# Patient Record
Sex: Female | Born: 1945 | Race: White | Hispanic: No | Smoking: Never smoker
Health system: Southern US, Community
[De-identification: ages and names within clinical notes are randomized; demographics above are authoritative.]

## PROBLEM LIST (undated history)

## (undated) DIAGNOSIS — K5792 Diverticulitis of intestine, part unspecified, without perforation or abscess without bleeding: Secondary | ICD-10-CM

## (undated) DIAGNOSIS — F32A Depression, unspecified: Secondary | ICD-10-CM

## (undated) DIAGNOSIS — C801 Malignant (primary) neoplasm, unspecified: Secondary | ICD-10-CM

## (undated) DIAGNOSIS — I10 Essential (primary) hypertension: Secondary | ICD-10-CM

## (undated) DIAGNOSIS — M1990 Unspecified osteoarthritis, unspecified site: Secondary | ICD-10-CM

## (undated) DIAGNOSIS — E785 Hyperlipidemia, unspecified: Secondary | ICD-10-CM

---

## 1999-04-07 HISTORY — PX: VARICOSE VEIN SURGERY: SHX832

## 1999-12-13 ENCOUNTER — Ambulatory Visit (HOSPITAL_COMMUNITY): Admission: RE | Admit: 1999-12-13 | Discharge: 1999-12-13 | Payer: Self-pay | Admitting: Plastic Surgery

## 2002-10-24 ENCOUNTER — Other Ambulatory Visit: Admission: RE | Admit: 2002-10-24 | Discharge: 2002-10-24 | Payer: Self-pay | Admitting: Obstetrics and Gynecology

## 2003-04-07 HISTORY — PX: FACIAL COSMETIC SURGERY: SHX629

## 2003-11-27 ENCOUNTER — Other Ambulatory Visit: Admission: RE | Admit: 2003-11-27 | Discharge: 2003-11-27 | Payer: Self-pay | Admitting: Obstetrics and Gynecology

## 2004-11-27 ENCOUNTER — Other Ambulatory Visit: Admission: RE | Admit: 2004-11-27 | Discharge: 2004-11-27 | Payer: Self-pay | Admitting: Obstetrics and Gynecology

## 2006-01-13 ENCOUNTER — Other Ambulatory Visit: Admission: RE | Admit: 2006-01-13 | Discharge: 2006-01-13 | Payer: Self-pay | Admitting: Obstetrics and Gynecology

## 2006-12-08 ENCOUNTER — Ambulatory Visit: Payer: Self-pay | Admitting: Family Medicine

## 2007-01-26 ENCOUNTER — Ambulatory Visit: Payer: Self-pay | Admitting: Gastroenterology

## 2007-02-09 ENCOUNTER — Other Ambulatory Visit: Admission: RE | Admit: 2007-02-09 | Discharge: 2007-02-09 | Payer: Self-pay | Admitting: Obstetrics and Gynecology

## 2008-02-16 ENCOUNTER — Other Ambulatory Visit: Admission: RE | Admit: 2008-02-16 | Discharge: 2008-02-16 | Payer: Self-pay | Admitting: Obstetrics and Gynecology

## 2009-08-06 ENCOUNTER — Ambulatory Visit: Payer: Self-pay | Admitting: Rheumatology

## 2009-09-19 ENCOUNTER — Ambulatory Visit: Payer: Self-pay | Admitting: Rheumatology

## 2013-03-02 ENCOUNTER — Ambulatory Visit: Payer: Self-pay | Admitting: Obstetrics and Gynecology

## 2013-11-24 ENCOUNTER — Emergency Department (HOSPITAL_COMMUNITY)
Admission: EM | Admit: 2013-11-24 | Discharge: 2013-11-24 | Disposition: A | Discharge status: Home / Self Care | Payer: Medicare Other | Attending: Emergency Medicine | Admitting: Emergency Medicine

## 2013-11-24 ENCOUNTER — Encounter (HOSPITAL_COMMUNITY): Payer: Self-pay | Admitting: Emergency Medicine

## 2013-11-24 DIAGNOSIS — R42 Dizziness and giddiness: Secondary | ICD-10-CM | POA: Insufficient documentation

## 2013-11-24 DIAGNOSIS — R197 Diarrhea, unspecified: Secondary | ICD-10-CM

## 2013-11-24 DIAGNOSIS — R110 Nausea: Secondary | ICD-10-CM

## 2013-11-24 DIAGNOSIS — I10 Essential (primary) hypertension: Secondary | ICD-10-CM | POA: Insufficient documentation

## 2013-11-24 DIAGNOSIS — K921 Melena: Secondary | ICD-10-CM | POA: Insufficient documentation

## 2013-11-24 DIAGNOSIS — Z880 Allergy status to penicillin: Secondary | ICD-10-CM | POA: Insufficient documentation

## 2013-11-24 DIAGNOSIS — Z79899 Other long term (current) drug therapy: Secondary | ICD-10-CM | POA: Insufficient documentation

## 2013-11-24 DIAGNOSIS — R112 Nausea with vomiting, unspecified: Secondary | ICD-10-CM | POA: Insufficient documentation

## 2013-11-24 HISTORY — DX: Essential (primary) hypertension: I10

## 2013-11-24 HISTORY — DX: Diverticulitis of intestine, part unspecified, without perforation or abscess without bleeding: K57.92

## 2013-11-24 LAB — URINALYSIS, ROUTINE W REFLEX MICROSCOPIC
Bilirubin Urine: NEGATIVE
Glucose, UA: NEGATIVE mg/dL
HGB URINE DIPSTICK: NEGATIVE
KETONES UR: 15 mg/dL — AB
Nitrite: NEGATIVE
PROTEIN: NEGATIVE mg/dL
Specific Gravity, Urine: 1.009 (ref 1.005–1.030)
UROBILINOGEN UA: 0.2 mg/dL (ref 0.0–1.0)
pH: 6 (ref 5.0–8.0)

## 2013-11-24 LAB — COMPREHENSIVE METABOLIC PANEL
ALK PHOS: 77 U/L (ref 39–117)
ALT: 16 U/L (ref 0–35)
AST: 27 U/L (ref 0–37)
Albumin: 4.7 g/dL (ref 3.5–5.2)
BILIRUBIN TOTAL: 0.7 mg/dL (ref 0.3–1.2)
BUN: 5 mg/dL — AB (ref 6–23)
CHLORIDE: 94 meq/L — AB (ref 96–112)
CO2: 26 meq/L (ref 19–32)
CREATININE: 0.7 mg/dL (ref 0.50–1.10)
Calcium: 9.8 mg/dL (ref 8.4–10.5)
GFR calc Af Amer: >90 mL/min (ref >90)
GFR, EST NON AFRICAN AMERICAN: 86 mL/min — AB (ref >90)
Glucose, Bld: 108 mg/dL — ABNORMAL HIGH (ref 70–99)
Potassium: 3.4 mEq/L — ABNORMAL LOW (ref 3.7–5.3)
Sodium: 134 mEq/L — ABNORMAL LOW (ref 137–147)
Total Protein: 8 g/dL (ref 6.0–8.3)

## 2013-11-24 LAB — URINE MICROSCOPIC-ADD ON

## 2013-11-24 LAB — CBC WITH DIFFERENTIAL/PLATELET
Basophils Absolute: 0 10*3/uL (ref 0.0–0.1)
Basophils Relative: 0 % (ref 0–1)
Eosinophils Absolute: 0 10*3/uL (ref 0.0–0.7)
Eosinophils Relative: 1 % (ref 0–5)
HEMATOCRIT: 46.3 % — AB (ref 36.0–46.0)
HEMOGLOBIN: 16.3 g/dL — AB (ref 12.0–15.0)
LYMPHS ABS: 1.2 10*3/uL (ref 0.7–4.0)
LYMPHS PCT: 19 % (ref 12–46)
MCH: 31.5 pg (ref 26.0–34.0)
MCHC: 35.2 g/dL (ref 30.0–36.0)
MCV: 89.4 fL (ref 78.0–100.0)
MONO ABS: 0.7 10*3/uL (ref 0.1–1.0)
MONOS PCT: 11 % (ref 3–12)
NEUTROS ABS: 4.5 10*3/uL (ref 1.7–7.7)
Neutrophils Relative %: 69 % (ref 43–77)
Platelets: 211 10*3/uL (ref 150–400)
RBC: 5.18 MIL/uL — AB (ref 3.87–5.11)
RDW: 13 % (ref 11.5–15.5)
WBC: 6.6 10*3/uL (ref 4.0–10.5)

## 2013-11-24 LAB — LIPASE, BLOOD: LIPASE: 32 U/L (ref 11–59)

## 2013-11-24 MED ORDER — PROMETHAZINE HCL 25 MG PO TABS: 25.0000 mg | ORAL_TABLET | Freq: Four times a day (QID) | ORAL | Status: DC | PRN

## 2013-11-24 MED ORDER — SODIUM CHLORIDE 0.9 % IV BOLUS (SEPSIS)
1000.0000 mL | Freq: Once | INTRAVENOUS | Status: AC
  Administered 2013-11-24: 1000 mL via INTRAVENOUS

## 2013-11-24 MED ORDER — ONDANSETRON HCL 4 MG/2ML IJ SOLN
4.0000 mg | Freq: Once | INTRAMUSCULAR | Status: AC
  Administered 2013-11-24: 4 mg via INTRAVENOUS
  Filled 2013-11-24: qty 2

## 2013-11-24 MED ORDER — SODIUM CHLORIDE 0.9 % IV SOLN
INTRAVENOUS | Status: DC
  Administered 2013-11-24: 13:00:00 via INTRAVENOUS

## 2013-11-24 MED ORDER — ONDANSETRON 4 MG PO TBDP: 4.0000 mg | ORAL_TABLET | Freq: Three times a day (TID) | ORAL | Status: DC | PRN

## 2013-11-24 MED ORDER — PROMETHAZINE HCL 25 MG/ML IJ SOLN
12.5000 mg | Freq: Once | INTRAMUSCULAR | Status: AC
  Administered 2013-11-24: 12.5 mg via INTRAVENOUS
  Filled 2013-11-24: qty 1

## 2013-11-24 NOTE — Discharge Instructions (Signed)
As we discussed continue current medications and antibiotics. Followup with your Dr. sometime in the next few days. Your urine today was suggestive of urinary tract infection. Urine culture is pending that'll probably be back over the next couple days. Are not going to start you on any new antibiotics. Start taking the Phenergan every 6 hours as directed. Supplement that with the Zofran sol tablet every 8 hours. Take this around the clock. Return for any newer worse symptoms. As we discussed probably need to be scheduled for a repeat colonoscopy.

## 2013-11-24 NOTE — ED Notes (Signed)
Pt reports diarrhea [place] ago [place], was given antibiotic. Reports nausea, hasn't been able to eat. Thinks she is dehydrated and feels very nauseated. No vomiting.

## 2013-11-24 NOTE — ED Notes (Signed)
Phlebotomy at bedside.

## 2013-11-24 NOTE — ED Notes (Signed)
Dr. Zackowski at bedside  

## 2013-11-24 NOTE — ED Notes (Signed)
Lab called to add on blood work 

## 2013-11-24 NOTE — ED Provider Notes (Signed)
CSN: [id number]     Arrival date & time 11/24/13  1050 History   First MD Initiated Contact with Patient 11/24/13 1116     Chief Complaint  Patient presents with  . Dehydration     (Consider location/radiation/quality/duration/timing/severity/associated sxs/prior Treatment) The history is provided by the patient and the spouse.   68 year old female referred in by her primary care doctor from [place]. Patient's had a persistent diarrheal illness. It started 2 weeks ago however no diarrhea for the past few days. Still with nausea and dizziness. Primary care doctor did extensive lab workup on Tuesday without any significant results. Stool cultures are pending. Patient also had CT scan without contrast done yesterday which was negative. Have patient referred in today for concerns for dehydration. Sounds like on Tuesday they wanted to admit her for hydration and patient refused. Patient's been taking Zofran for the nausea. Was not taking it consistently however though. Patient has nausea and dizziness now. The nausea is intermittent but is really taking her appetite away. Patient has been drinking Gatorade. Patient's been having some problems over the last 2 years. Did not have a regular doctor last colonoscopy was 5 years ago. When patient was down [place] when these symptoms started and was started on antibiotics down there. Did have some bloody diarrhea at that time. Patient now with no further diarrhea 2 days ago still having crampy abdominal pain. That has also resolved.  Past Medical History  Diagnosis Date  . Hypertension   . Diverticulitis    History reviewed. No pertinent past surgical history. No family history on file. History  Substance Use Topics  . Smoking status: Never Smoker   . Smokeless tobacco: Not on file  . Alcohol Use: Yes   OB History   Grav Para Term Preterm Abortions TAB SAB Ect Mult Living                 Review of Systems  Constitutional: Positive for  fatigue. Negative for fever.  HENT: Negative for congestion.   Eyes: Negative for visual disturbance.  Respiratory: Negative for shortness of breath.   Cardiovascular: Negative for chest pain.  Gastrointestinal: Positive for nausea, vomiting, abdominal pain and blood in stool.  Genitourinary: Negative for dysuria.  Musculoskeletal: Negative for myalgias.  Skin: Negative for rash.  Neurological: Positive for dizziness and light-headedness. Negative for headaches.  Hematological: Does not bruise/bleed easily.  Psychiatric/Behavioral: Negative for confusion.      Allergies  Cephalexin; Augmentin; Darvon; Epinephrine hcl; Erythromycin; and Parafon forte dsc  Home Medications   Prior to Admission medications   Medication Sig Start Date End Date Taking? Authorizing Provider  ciprofloxacin (CIPRO) 250 MG tablet Take 250 mg by mouth 2 (two) times daily. Starting 11/22/13 for 10 days 11/22/13  Yes Historical Provider, MD  latanoprost (XALATAN) 0.005 % ophthalmic solution Place 1 drop into both eyes at bedtime. 11/07/13  Yes Historical Provider, MD  losartan-hydrochlorothiazide (HYZAAR) 100-12.5 MG per tablet Take 1 tablet by mouth daily. 09/26/13  Yes Historical Provider, MD  metroNIDAZOLE (FLAGYL) 500 MG tablet Take 1 tablet by mouth 2 (two) times daily. For 10 days starting 11/22/13 11/22/13  Yes Historical Provider, MD  pantoprazole (PROTONIX) 40 MG tablet Take 40 mg by mouth daily. 11/22/13  Yes Historical Provider, MD  sucralfate (CARAFATE) 1 G tablet Take 1 g by mouth 4 (four) times daily. 11/22/13  Yes Historical Provider, MD  zolpidem (AMBIEN) 10 MG tablet Take 5 mg by mouth at bedtime. 10/20/13  Yes Historical Provider, MD  ondansetron (ZOFRAN ODT) 4 MG disintegrating tablet Take 1 tablet (4 mg total) by mouth every 8 (eight) hours as needed for nausea or vomiting. 11/24/13   Shelda JakesScott W. Donovin Kraemer, MD  promethazine (PHENERGAN) 25 MG tablet Take 1 tablet (25 mg total) by mouth every 6 (six) hours as  needed for nausea or vomiting. 11/24/13   Shelda JakesScott W. Nethra Mehlberg, MD   BP 135/90  Pulse 67  Temp(Src) 98.1 F (36.7 C) (Oral)  Resp 16  Wt 128 lb 5 oz (58.202 kg)  SpO2 100% Physical Exam  Nursing note and vitals reviewed. Constitutional: She is oriented to person, place, and time. She appears well-developed and well-nourished. No distress.  HENT:  Head: Normocephalic and atraumatic.  Mucous membranes slightly dry.  Eyes: Conjunctivae and EOM are normal. Pupils are equal, round, and reactive to light.  Neck: Normal range of motion.  Cardiovascular: Normal rate, regular rhythm and normal heart sounds.   No murmur heard. Pulmonary/Chest: Effort normal and breath sounds normal. No respiratory distress.  Abdominal: Soft. Bowel sounds are normal. There is no tenderness.  Musculoskeletal: Normal range of motion. She exhibits no tenderness.  Neurological: She is alert and oriented to person, place, and time. No cranial nerve deficit. Coordination normal.  Skin: Skin is warm. No rash noted.    ED Course  Procedures (including critical care time) Labs Review Labs Reviewed  CBC WITH DIFFERENTIAL - Abnormal; Notable for the following:    RBC 5.18 (*)    Hemoglobin 16.3 (*)    HCT 46.3 (*)    All other components within normal limits  COMPREHENSIVE METABOLIC PANEL - Abnormal; Notable for the following:    Sodium 134 (*)    Potassium 3.4 (*)    Chloride 94 (*)    Glucose, Bld 108 (*)    BUN 5 (*)    GFR calc non Af Amer 86 (*)    All other components within normal limits  URINALYSIS, ROUTINE W REFLEX MICROSCOPIC - Abnormal; Notable for the following:    Ketones, ur 15 (*)    Leukocytes, UA MODERATE (*)    All other components within normal limits  URINE MICROSCOPIC-ADD ON - Abnormal; Notable for the following:    Squamous Epithelial / LPF FEW (*)    All other components within normal limits  URINE CULTURE  LIPASE, BLOOD   Results for orders placed during the hospital encounter of  11/24/13  CBC WITH DIFFERENTIAL      Result Value Ref Range   WBC 6.6  4.0 - 10.5 K/uL   RBC 5.18 (*) 3.87 - 5.11 MIL/uL   Hemoglobin 16.3 (*) 12.0 - 15.0 g/dL   HCT 16.146.3 (*) 09.636.0 - 04.546.0 %   MCV 89.4  78.0 - 100.0 fL   MCH 31.5  26.0 - 34.0 pg   MCHC 35.2  30.0 - 36.0 g/dL   RDW 40.913.0  81.111.5 - 91.415.5 %   Platelets 211  150 - 400 K/uL   Neutrophils Relative % 69  43 - 77 %   Neutro Abs 4.5  1.7 - 7.7 K/uL   Lymphocytes Relative 19  12 - 46 %   Lymphs Abs 1.2  0.7 - 4.0 K/uL   Monocytes Relative 11  3 - 12 %   Monocytes Absolute 0.7  0.1 - 1.0 K/uL   Eosinophils Relative 1  0 - 5 %   Eosinophils Absolute 0.0  0.0 - 0.7 K/uL   Basophils Relative 0  0 - 1 %   Basophils Absolute 0.0  0.0 - 0.1 K/uL  COMPREHENSIVE METABOLIC PANEL      Result Value Ref Range   Sodium 134 (*) 137 - 147 mEq/L   Potassium 3.4 (*) 3.7 - 5.3 mEq/L   Chloride 94 (*) 96 - 112 mEq/L   CO2 26  19 - 32 mEq/L   Glucose, Bld 108 (*) 70 - 99 mg/dL   BUN 5 (*) 6 - 23 mg/dL   Creatinine, Ser 1.610.70  0.50 - 1.10 mg/dL   Calcium 9.8  8.4 - 09.610.5 mg/dL   Total Protein 8.0  6.0 - 8.3 g/dL   Albumin 4.7  3.5 - 5.2 g/dL   AST 27  0 - 37 U/L   ALT 16  0 - 35 U/L   Alkaline Phosphatase 77  39 - 117 U/L   Total Bilirubin 0.7  0.3 - 1.2 mg/dL   GFR calc non Af Amer 86 (*) >90 mL/min   GFR calc Af Amer >90  >90 mL/min  URINALYSIS, ROUTINE W REFLEX MICROSCOPIC      Result Value Ref Range   Color, Urine YELLOW  YELLOW   APPearance CLEAR  CLEAR   Specific Gravity, Urine 1.009  1.005 - 1.030   pH 6.0  5.0 - 8.0   Glucose, UA NEGATIVE  NEGATIVE mg/dL   Hgb urine dipstick NEGATIVE  NEGATIVE   Bilirubin Urine NEGATIVE  NEGATIVE   Ketones, ur 15 (*) NEGATIVE mg/dL   Protein, ur NEGATIVE  NEGATIVE mg/dL   Urobilinogen, UA 0.2  0.0 - 1.0 mg/dL   Nitrite NEGATIVE  NEGATIVE   Leukocytes, UA MODERATE (*) NEGATIVE  LIPASE, BLOOD      Result Value Ref Range   Lipase 32  11 - 59 U/L  URINE MICROSCOPIC-ADD ON      Result Value Ref  Range   Squamous Epithelial / LPF FEW (*) RARE   WBC, UA 7-10  <3 WBC/hpf   Bacteria, UA RARE  RARE    Imaging Review No results found.   EKG Interpretation None      MDM   Final diagnoses:  Nausea  Diarrhea     Patient with about a two-year history of some abdominal diarrhea problems. Recently had to restart it down with [place] primary care doctor in [place] area. Patient [place] started with diarrhea prior to the trip continued while there was seen by a physician and started on antibiotic. Patient did have bloody diarrhea at that time. No diarrhea for the past few days. Patient seen by primary care Dr. Randell [place] on Tuesday started on Cipro and Flagyl head CT scan done yesterday without contrast that was negative. Patient turned in stool cultures yesterday the results are pending. Referred here for concerns of being dehydrated. Patient now does with persistent nausea and dizziness. Patient had abdominal cramping still ongoing on Monday and Tuesday but that has resolved.  Today's workup without significant findings of dehydration. Patient given 1 L of normal saline seems to feel better. Patient also treated with Phenergan which seemed to have helped the nausea significantly. Will continue Zofran and Phenergan orally. Patient's urinalysis is suggestive of perhaps a urinary tract infection.  Urine culture is pending we'll not change antibiotics at this time. Since patient has been on several antibiotics and does have a lot of allergies to antibiotics. Patient's GI problems now currently could be due to a floral change in the gut and perhaps  maybe even would be wise to stop antibiotics and maybe start probe biotics. However urine culture will direct the need for treatment of the ureter there tract infection if present. Patient will have followup with her record Dr. in the next few days.    Shelda Jakes, MD 11/24/13 [phone]

## 2013-11-25 LAB — URINE CULTURE
COLONY COUNT: NO GROWTH
Culture: NO GROWTH

## 2015-05-09 ENCOUNTER — Other Ambulatory Visit: Payer: Self-pay | Admitting: Physician Assistant

## 2015-05-09 DIAGNOSIS — R109 Unspecified abdominal pain: Secondary | ICD-10-CM

## 2015-05-16 ENCOUNTER — Ambulatory Visit
Admission: RE | Admit: 2015-05-16 | Discharge: 2015-05-16 | Disposition: A | Discharge status: Home / Self Care | Payer: Medicare Other | Source: Ambulatory Visit | Attending: Physician Assistant | Admitting: Physician Assistant

## 2015-05-16 DIAGNOSIS — R109 Unspecified abdominal pain: Secondary | ICD-10-CM

## 2016-10-07 ENCOUNTER — Other Ambulatory Visit: Payer: Self-pay | Admitting: Cardiology

## 2016-10-07 DIAGNOSIS — R519 Headache, unspecified: Secondary | ICD-10-CM

## 2016-10-07 DIAGNOSIS — R51 Headache: Principal | ICD-10-CM

## 2016-10-17 ENCOUNTER — Ambulatory Visit
Admission: RE | Admit: 2016-10-17 | Discharge: 2016-10-17 | Disposition: A | Discharge status: Home / Self Care | Payer: Medicare Other | Source: Ambulatory Visit | Attending: Cardiology | Admitting: Cardiology

## 2016-10-17 DIAGNOSIS — R51 Headache: Secondary | ICD-10-CM | POA: Diagnosis not present

## 2016-10-17 DIAGNOSIS — R519 Headache, unspecified: Secondary | ICD-10-CM

## 2016-10-17 LAB — POCT I-STAT CREATININE: Creatinine, Ser: 0.7 mg/dL (ref 0.44–1.00)

## 2016-10-17 MED ORDER — GADOBENATE DIMEGLUMINE 529 MG/ML IV SOLN
10.0000 mL | Freq: Once | INTRAVENOUS | Status: AC | PRN
  Administered 2016-10-17: 10 mL via INTRAVENOUS

## 2017-04-27 ENCOUNTER — Ambulatory Visit: Payer: Self-pay | Admitting: Urology

## 2017-05-05 ENCOUNTER — Ambulatory Visit: Payer: Self-pay | Admitting: Urology

## 2017-05-12 ENCOUNTER — Encounter: Payer: Self-pay | Admitting: Urology

## 2017-05-12 ENCOUNTER — Ambulatory Visit (INDEPENDENT_AMBULATORY_CARE_PROVIDER_SITE_OTHER): Payer: Medicare Other | Admitting: Urology

## 2017-05-12 VITALS — BP 181/80 | HR 86 | Ht 61.0 in | Wt 130.0 lb

## 2017-05-12 DIAGNOSIS — R3129 Other microscopic hematuria: Secondary | ICD-10-CM

## 2017-05-12 DIAGNOSIS — R8290 Unspecified abnormal findings in urine: Secondary | ICD-10-CM | POA: Diagnosis not present

## 2017-05-12 LAB — URINALYSIS, COMPLETE
BILIRUBIN UA: NEGATIVE
Glucose, UA: NEGATIVE
KETONES UA: NEGATIVE
Leukocytes, UA: NEGATIVE
Nitrite, UA: NEGATIVE
Protein, UA: NEGATIVE
Specific Gravity, UA: 1.01 (ref 1.005–1.030)
UUROB: 0.2 mg/dL (ref 0.2–1.0)
pH, UA: 7 (ref 5.0–7.5)

## 2017-05-12 NOTE — Progress Notes (Signed)
05/12/2017 4:38 PM   ISSIS LINDSETH 1945-09-18 [id number]  Referring provider: Armando Gang, FNP [address] [place], [place] [address]  Chief Complaint  Patient presents with  . Hematuria    New Patient    HPI: 71 year old female referred for further evaluation of possible microscopic hematuria.  She was seen and evaluated by Dr. Dalbert Garnet, GYN for multiple recurrent vaginal infections.  During that time, she had several urinalyses including on 01/2017 as well as 03/2017 which showed trace blood on dip without evidence of microscopic hematuria.  Her UA today also shows the same with persistent trace blood on dip but no evidence of microscopic hematuria.  She does have a history of remote urinary tract infection in 01/2016 at which time she grew E. coli.  She was highly symptomatic this point time with dysuria, urgency and frequency.  She denies any urinary symptoms today.  She does report that she has had foul-smelling urine on several occasions which is quite worrisome to her.  She was concerned that this may be related to infection.  She notices it more when she eats certain foods.  She is a non-smoker.  No history of stones.  No flank pain.    PMH: Past Medical History:  Diagnosis Date  . Diverticulitis   . Hypertension     Surgical History: Past Surgical History:  Procedure Laterality Date  . FACIAL COSMETIC SURGERY  04/2003  . VARICOSE VEIN SURGERY  04/1999    Home Medications:  Allergies as of 05/12/2017      Reactions   Cephalexin Anaphylaxis   Articaine-epinephrine    Other reaction(s): Unknown Other reaction(s): Unknown   Augmentin [amoxicillin-pot Clavulanate]    Anxiety, insomnia, diarrhea    Darvon [propoxyphene] Nausea And Vomiting   Epinephrine Other (See Comments)   Increased blood pressure    Erythromycin Other (See Comments)   Unknown    Parafon Forte Dsc [chlorzoxazone] Nausea And Vomiting      Medication List        Accurate as  of 05/12/17  4:38 PM. Always use your most recent med list.          Calcium Carbonate-Vitamin D 600-400 MG-UNIT tablet Take by mouth.   ciprofloxacin 250 MG tablet Commonly known as:  CIPRO Take 250 mg by mouth 2 (two) times daily. Starting 11/22/13 for 10 days   clindamycin 150 MG capsule Commonly known as:  CLEOCIN   cyclobenzaprine 5 MG tablet Commonly known as:  FLEXERIL cyclobenzaprine 5 mg tablet  Take 1 tablet every day by oral route at bedtime.   diazepam 2 MG tablet Commonly known as:  VALIUM Take 0.5 mg by mouth.   diclofenac sodium 1 % Gel Commonly known as:  VOLTAREN Apply topically.   FISH OIL PO Take by mouth.   latanoprost 0.005 % ophthalmic solution Commonly known as:  XALATAN Place 1 drop into both eyes at bedtime.   losartan-hydrochlorothiazide 100-12.5 MG tablet Commonly known as:  HYZAAR Take 1 tablet by mouth daily.   meloxicam 15 MG tablet Commonly known as:  MOBIC meloxicam 15 mg tablet   metroNIDAZOLE 500 MG tablet Commonly known as:  FLAGYL Take 1 tablet by mouth 2 (two) times daily. For 10 days starting 11/22/13   ondansetron 4 MG disintegrating tablet Commonly known as:  ZOFRAN ODT Take 1 tablet (4 mg total) by mouth every 8 (eight) hours as needed for nausea or vomiting.   pantoprazole 40 MG tablet Commonly known as:  PROTONIX  Take 40 mg by mouth daily.   PLASMA PROTEIN FRACTION IV Take by mouth.   promethazine 25 MG tablet Commonly known as:  PHENERGAN Take 1 tablet (25 mg total) by mouth every 6 (six) hours as needed for nausea or vomiting.   sucralfate 1 g tablet Commonly known as:  CARAFATE Take 1 g by mouth 4 (four) times daily.   valACYclovir 1000 MG tablet Commonly known as:  VALTREX valacyclovir 1 gram tablet   zolpidem 10 MG tablet Commonly known as:  AMBIEN Take 5 mg by mouth at bedtime.       Allergies:  Allergies  Allergen Reactions  . Cephalexin Anaphylaxis  . Articaine-Epinephrine     Other  reaction(s): Unknown Other reaction(s): Unknown   . Augmentin [Amoxicillin-Pot Clavulanate]     Anxiety, insomnia, diarrhea   . Darvon [Propoxyphene] Nausea And Vomiting  . Epinephrine Other (See Comments)    Increased blood pressure   . Erythromycin Other (See Comments)    Unknown   . Parafon Forte Dsc [Chlorzoxazone] Nausea And Vomiting    Family History: Family History  Problem Relation Age of Onset  . Prostate cancer Neg Hx   . Bladder Cancer Neg Hx   . Kidney cancer Neg Hx     Social History:  reports that  has never smoked. she has never used smokeless tobacco. She reports that she drinks alcohol. Her drug history is not on file.  ROS: UROLOGY Frequent Urination?: No Hard to postpone urination?: No Burning/pain with urination?: No Get up at night to urinate?: No Leakage of urine?: No Urine stream starts and stops?: No Trouble starting stream?: No Do you have to strain to urinate?: No Blood in urine?: Yes Urinary tract infection?: No Sexually transmitted disease?: No Injury to kidneys or bladder?: No Painful intercourse?: No Weak stream?: No Currently pregnant?: No Vaginal bleeding?: No Last menstrual period?: n  Gastrointestinal Nausea?: No Vomiting?: No Indigestion/heartburn?: No Diarrhea?: No Constipation?: No  Constitutional Fever: No Night sweats?: No Weight loss?: No Fatigue?: No  Skin Skin rash/lesions?: No Itching?: No  Eyes Blurred vision?: No Double vision?: No  Ears/Nose/Throat Sore throat?: No Sinus problems?: No  Hematologic/Lymphatic Swollen glands?: No Easy bruising?: Yes  Cardiovascular Leg swelling?: No Chest pain?: No  Respiratory Cough?: No Shortness of breath?: No  Endocrine Excessive thirst?: No  Musculoskeletal Back pain?: No Joint pain?: Yes  Neurological Headaches?: No Dizziness?: No  Psychologic Depression?: No Anxiety?: No  Physical Exam: BP (!) 181/80   Pulse 86   Ht 5\' 1"  (1.549 m)   Wt  130 lb (59 kg)   BMI 24.56 kg/m   Constitutional:  Alert and oriented, No acute distress. HEENT: Vincent AT, moist mucus membranes.  Trachea midline, no masses. Cardiovascular: No clubbing, cyanosis, or edema. Respiratory: Normal respiratory effort, no increased work of breathing. GI: Abdomen is soft, nontender, nondistended, no abdominal masses GU: No CVA tenderness.  Skin: No rashes, bruises or suspicious lesions. Neurologic: Grossly intact, no focal deficits, moving all 4 extremities. Psychiatric: Normal mood and affect.  Laboratory Data: Lab Results  Component Value Date   WBC 6.6 11/24/2013   HGB 16.3 (H) 11/24/2013   HCT 46.3 (H) 11/24/2013   MCV 89.4 11/24/2013   PLT 211 11/24/2013    Lab Results  Component Value Date   CREATININE 0.70 10/17/2016    Urinalysis UA reviewed today, see epic.  No evidence of microscopic hematuria.  Pertinent Imaging: NA  Assessment & Plan:    Insert assessment  and plan  1. Microscopic hematuria Lengthy discussion today about the definition of microscopic hematuria Review of multiple urinalysis reveal multiple false positives with trace on dip but no evidence of microscopic blood on microscopic evaluation As such, no indication for Kathlene NovemberMike scopic hematuria workup Patient educated today - Urinalysis, Complete  2. Foul smelling urine Likely related to dietary intake Incidentally, patient does report that she is eating a lot of asparagus recently and this is around the time she started noticing her foul-smelling urine which resolved spontaneously Asymptomatic   Return if symptoms worsen or fail to improve.  Vanna ScotlandAshley Ziaire Bieser, MD  [facility] [address], [address] [place], [place] [address] [phone]

## 2017-08-29 IMAGING — MR MR HEAD WO/W CM
9 of 12 series · 31 of 48 positions shown · IV contrast (10mL MULTIHANCE)
Comparison: 12/08/2006

CLINICAL DATA: Left-sided head pressure which is intermittent and
episodic.

EXAM:
MRI HEAD WITHOUT AND WITH CONTRAST
TECHNIQUE: Multiplanar, multiecho pulse sequences of the brain and surrounding
structures were obtained without and with intravenous contrast.
CONTRAST:  10mL MULTIHANCE GADOBENATE DIMEGLUMINE 529 MG/ML IV SOLN

[Series 2: T1 · sagittal · 5.0mm · 0.47mm/px · 1 of 21 slices shown]
[im 1/21]
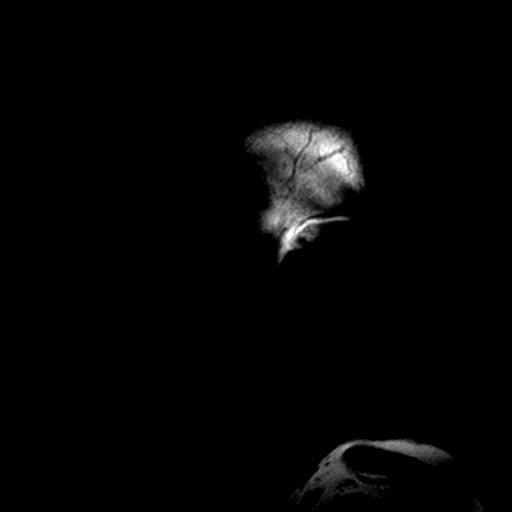

[Series 4: DWI · axial · 3.0mm · 0.94mm/px · z∈[-44,+101]mm · 4 of 50 slices shown (1 of 2)]
[im 1/50]
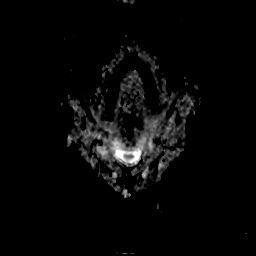
[im 17/50]
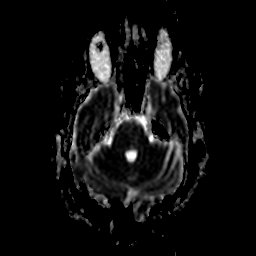
[im 33/50]
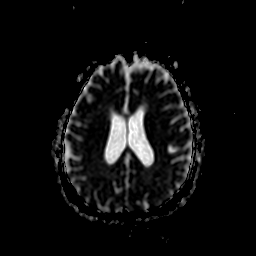
[im 50/50]
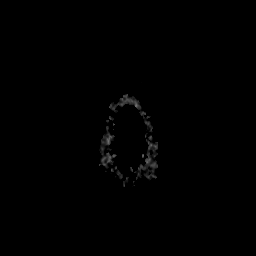

[Series 7: DWI · coronal · 5.0mm · 1.80mm/px · 3 of 39 slices shown (2 of 2)]
[im 1/39]
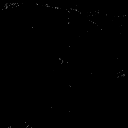
[im 20/39]
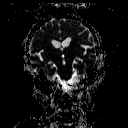
[im 39/39]
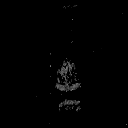

[Series 9: T2 · axial · 5.0mm · 0.45mm/px · z∈[-49,+102]mm · 2 of 23 slices shown (1 of 2)]
[im 1/23]
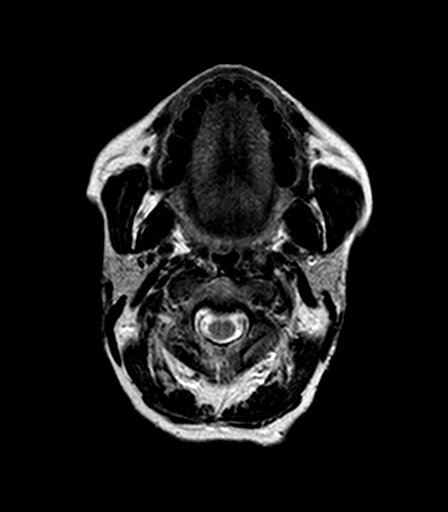
[im 23/23]
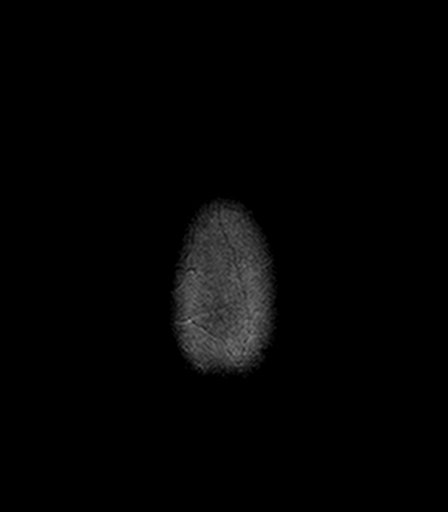

[Series 10: FLAIR · axial · 3.0mm · 0.90mm/px · z∈[-43,+102]mm · 4 of 50 slices shown]
[im 1/50]
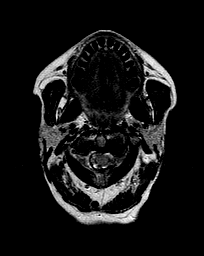
[im 17/50]
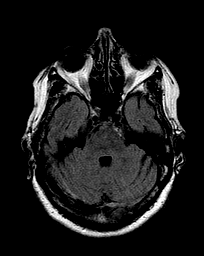
[im 33/50]
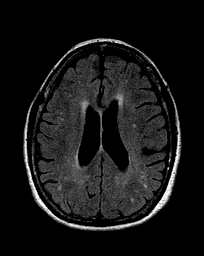
[im 50/50]
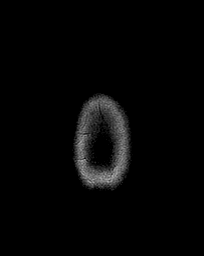

[Series 11: T2 · axial · 5.0mm · 0.45mm/px · z∈[-50,+103]mm · 2 of 27 slices shown (2 of 2)]
[im 1/27]
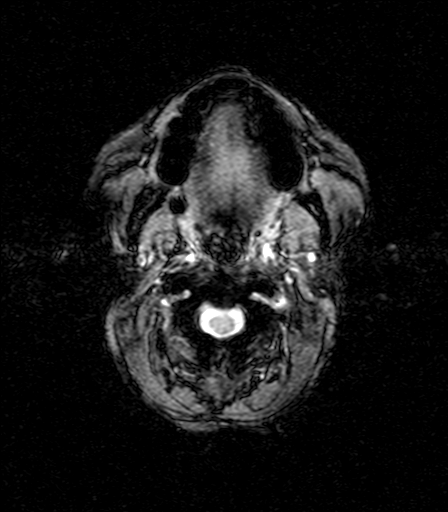
[im 27/27]
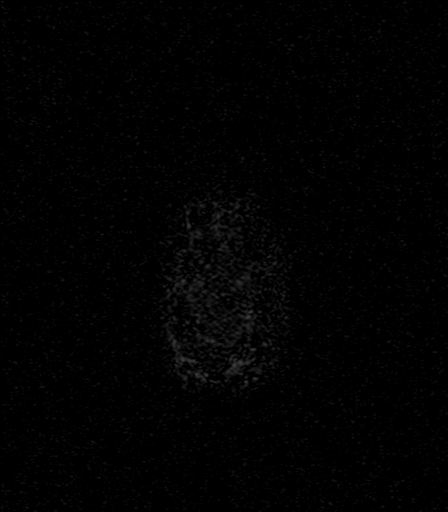

[Series 13: T2 post-contrast · coronal · 5.0mm · 0.45mm/px · 2 of 29 slices shown]
[im 1/29]
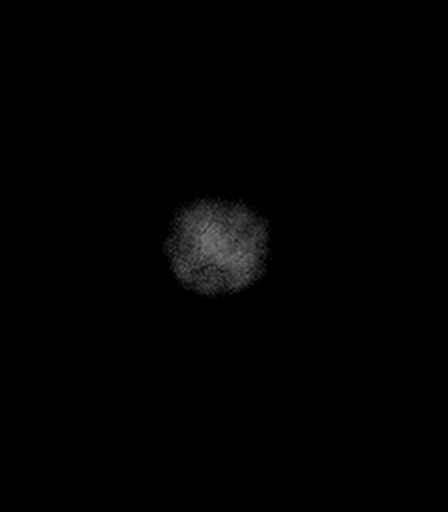
[im 29/29]
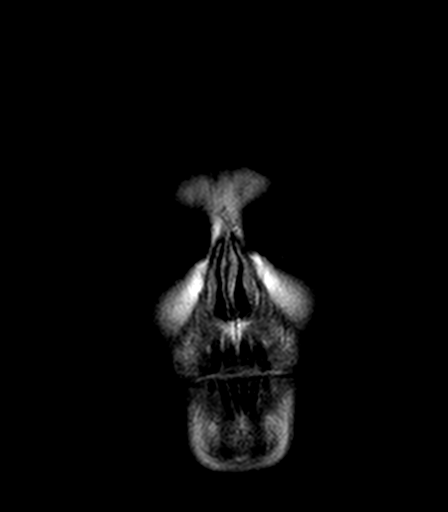

[Series 14: T1 post-contrast · axial · 1.0mm · 0.45mm/px · z∈[-51,+105]mm · 11 of 160 slices shown (1 of 2)]
[im 1/160]
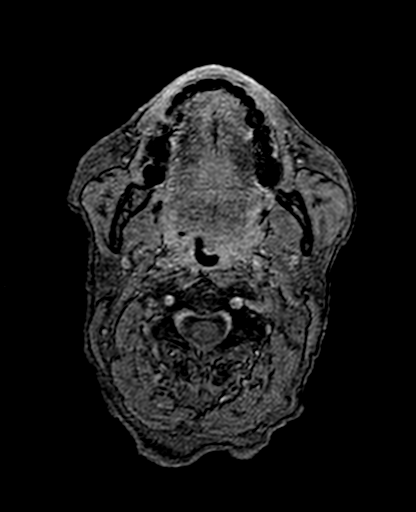
[im 16/160]
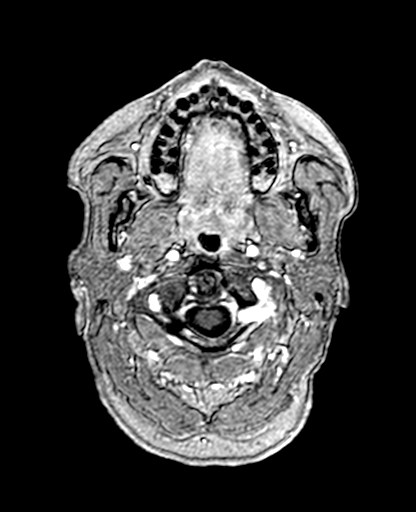
[im 32/160]
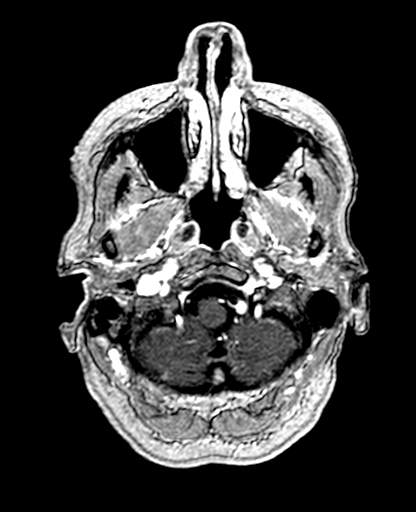
[im 48/160]
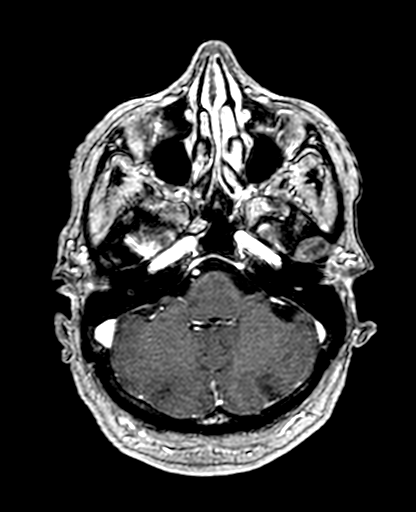
[im 64/160]
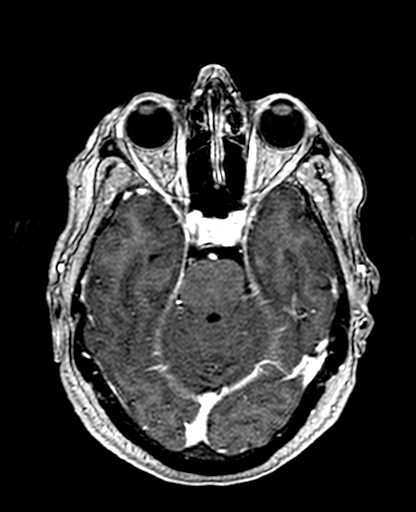
[im 80/160]
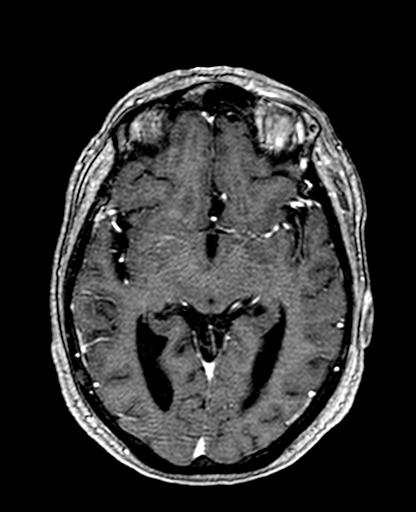
[im 96/160]
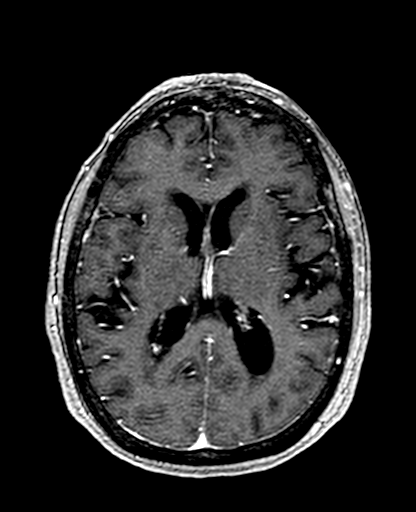
[im 112/160]
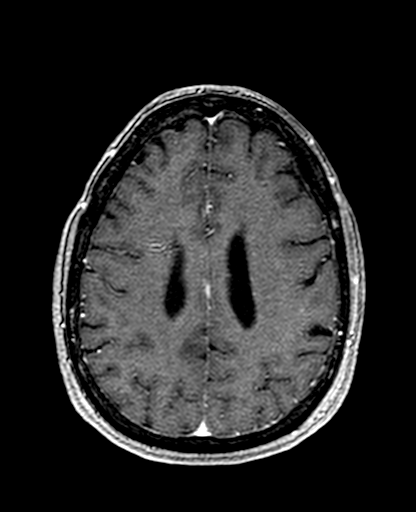
[im 128/160]
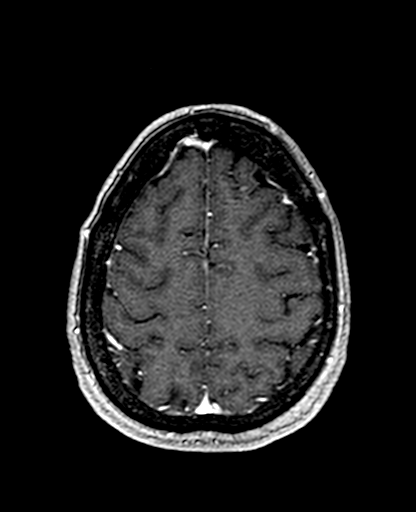
[im 144/160]
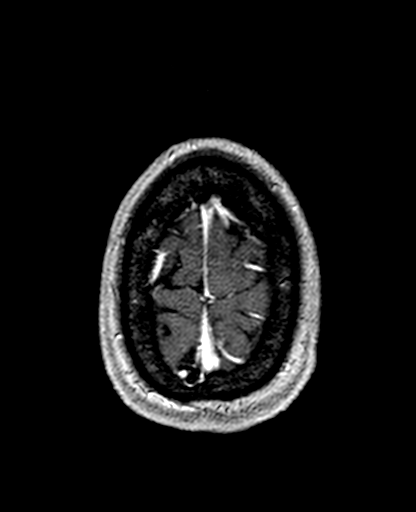
[im 160/160]
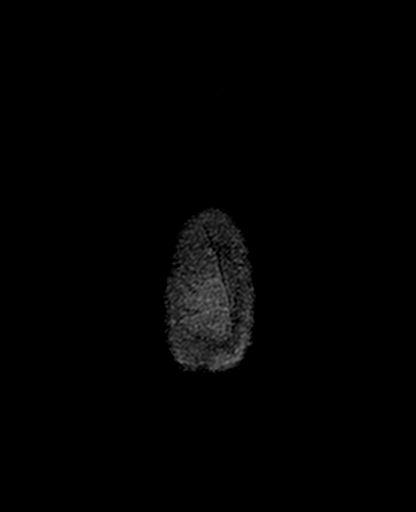

[Series 15: T1 post-contrast · coronal · 5.0mm · 0.45mm/px · 2 of 29 slices shown (2 of 2)]
[im 1/29]
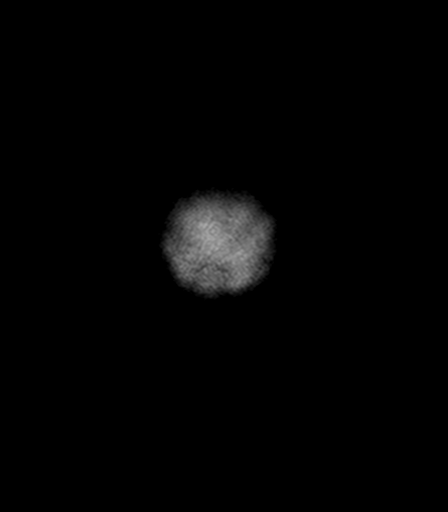
[im 29/29]
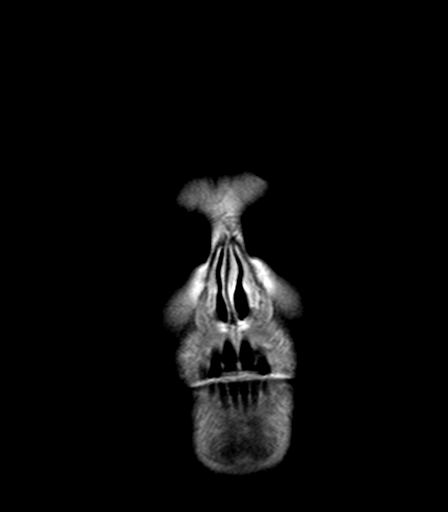

[31 of 48 positions shown; findings below may reference images not displayed]

FINDINGS: Brain: Diffusion imaging does not show any acute or subacute
infarction. The brainstem and cerebellum are normal. Cerebral
hemispheres show scattered foci of T2 and FLAIR signal within the
deep and subcortical white matter consistent with chronic small
vessel change. This is slightly progressive when compared to the
study of 5448. No cortical or large vessel territory infarction. No
mass lesion, hemorrhage, hydrocephalus or extra-axial collection.
After contrast administration, no abnormal enhancement occurs.

Vascular: Major vessels at the base of the brain show flow.

Skull and upper cervical spine: Negative

Sinuses/Orbits: Clear/normal

Other: None significant
IMPRESSION: No cause of left-sided symptoms identified. No acute or reversible
finding. Mild chronic small-vessel change of the cerebral
hemispheric white matter.

## 2019-02-03 ENCOUNTER — Emergency Department
Admission: EM | Admit: 2019-02-03 | Discharge: 2019-02-03 | Disposition: A | Discharge status: Home / Self Care | Payer: Medicare Other | Attending: Emergency Medicine | Admitting: Emergency Medicine

## 2019-02-03 ENCOUNTER — Encounter: Payer: Self-pay | Admitting: Emergency Medicine

## 2019-02-03 DIAGNOSIS — Z79899 Other long term (current) drug therapy: Secondary | ICD-10-CM | POA: Insufficient documentation

## 2019-02-03 DIAGNOSIS — I10 Essential (primary) hypertension: Secondary | ICD-10-CM | POA: Insufficient documentation

## 2019-02-03 DIAGNOSIS — L245 Irritant contact dermatitis due to other chemical products: Secondary | ICD-10-CM | POA: Insufficient documentation

## 2019-02-03 DIAGNOSIS — R531 Weakness: Secondary | ICD-10-CM | POA: Insufficient documentation

## 2019-02-03 DIAGNOSIS — E876 Hypokalemia: Secondary | ICD-10-CM | POA: Diagnosis not present

## 2019-02-03 DIAGNOSIS — R21 Rash and other nonspecific skin eruption: Secondary | ICD-10-CM | POA: Diagnosis present

## 2019-02-03 LAB — HEPATIC FUNCTION PANEL
ALT: 11 U/L (ref 0–44)
AST: 20 U/L (ref 15–41)
Albumin: 4.4 g/dL (ref 3.5–5.0)
Alkaline Phosphatase: 67 U/L (ref 38–126)
Bilirubin, Direct: 0.1 mg/dL (ref 0.0–0.2)
Indirect Bilirubin: 0.8 mg/dL (ref 0.3–0.9)
Total Bilirubin: 0.9 mg/dL (ref 0.3–1.2)
Total Protein: 7.7 g/dL (ref 6.5–8.1)

## 2019-02-03 LAB — BASIC METABOLIC PANEL
Anion gap: 11 (ref 5–15)
BUN: 18 mg/dL (ref 8–23)
CO2: 25 mmol/L (ref 22–32)
Calcium: 9.5 mg/dL (ref 8.9–10.3)
Chloride: 104 mmol/L (ref 98–111)
Creatinine, Ser: 0.7 mg/dL (ref 0.44–1.00)
GFR calc Af Amer: >60 mL/min (ref >60)
GFR calc non Af Amer: >60 mL/min (ref >60)
Glucose, Bld: 112 mg/dL — ABNORMAL HIGH (ref 70–99)
Potassium: 3.2 mmol/L — ABNORMAL LOW (ref 3.5–5.1)
Sodium: 140 mmol/L (ref 135–145)

## 2019-02-03 LAB — TROPONIN I (HIGH SENSITIVITY): Troponin I (High Sensitivity): 4 ng/L (ref <18)

## 2019-02-03 LAB — CBC
HCT: 45.4 % (ref 36.0–46.0)
Hemoglobin: 15.1 g/dL — ABNORMAL HIGH (ref 12.0–15.0)
MCH: 29.3 pg (ref 26.0–34.0)
MCHC: 33.3 g/dL (ref 30.0–36.0)
MCV: 88 fL (ref 80.0–100.0)
Platelets: 206 10*3/uL (ref 150–400)
RBC: 5.16 MIL/uL — ABNORMAL HIGH (ref 3.87–5.11)
RDW: 12.8 % (ref 11.5–15.5)
WBC: 7.5 10*3/uL (ref 4.0–10.5)
nRBC: 0 % (ref 0.0–0.2)

## 2019-02-03 MED ORDER — LOSARTAN POTASSIUM 50 MG PO TABS
100.0000 mg | ORAL_TABLET | Freq: Once | ORAL | Status: AC
  Administered 2019-02-03: 100 mg via ORAL
  Filled 2019-02-03: qty 2

## 2019-02-03 MED ORDER — HYDROCHLOROTHIAZIDE 12.5 MG PO CAPS
12.5000 mg | ORAL_CAPSULE | Freq: Once | ORAL | Status: AC
  Administered 2019-02-03: 12.5 mg via ORAL
  Filled 2019-02-03: qty 1

## 2019-02-03 MED ORDER — DIPHENHYDRAMINE HCL 50 MG/ML IJ SOLN
50.0000 mg | Freq: Once | INTRAMUSCULAR | Status: AC
  Administered 2019-02-03: 50 mg via INTRAVENOUS

## 2019-02-03 MED ORDER — CLOBETASOL PROPIONATE 0.05 % EX OINT: TOPICAL_OINTMENT | Freq: Two times a day (BID) | CUTANEOUS | Status: DC

## 2019-02-03 MED ORDER — DIPHENHYDRAMINE HCL 50 MG PO TABS: 50.0000 mg | ORAL_TABLET | Freq: Every evening | ORAL | 0 refills | Status: DC | PRN

## 2019-02-03 MED ORDER — CLOBETASOL PROPIONATE 0.05 % EX OINT
TOPICAL_OINTMENT | Freq: Once | CUTANEOUS | Status: AC
  Administered 2019-02-03: 07:00:00 via TOPICAL
  Filled 2019-02-03: qty 15

## 2019-02-03 MED ORDER — DIPHENHYDRAMINE HCL 50 MG/ML IJ SOLN
INTRAMUSCULAR | Status: AC
  Filled 2019-02-03: qty 1

## 2019-02-03 MED ORDER — METHYLPREDNISOLONE SODIUM SUCC 125 MG IJ SOLR
INTRAMUSCULAR | Status: AC
  Filled 2019-02-03: qty 2

## 2019-02-03 MED ORDER — POTASSIUM CHLORIDE 20 MEQ PO PACK
40.0000 meq | PACK | Freq: Once | ORAL | Status: AC
  Administered 2019-02-03: 40 meq via ORAL
  Filled 2019-02-03: qty 2

## 2019-02-03 MED ORDER — CLOBETASOL PROPIONATE 0.05 % EX OINT: 1.0000 "application " | TOPICAL_OINTMENT | Freq: Two times a day (BID) | CUTANEOUS | 0 refills | Status: DC
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
## 2019-02-03 NOTE — ED Provider Notes (Signed)
[facility] Emergency Department Provider Note  ____________________________________________  Time seen: Approximately 5:38 AM  I have reviewed the triage vital signs and the nursing notes.   HISTORY  Chief Complaint Rash and Weakness   HPI Martha Nguyen is a 73 y.o. female history of hypertension who presents for evaluation of a rash and weakness.  Patient reports that she sprayed her house with a pesticide called Precor several weeks ago.  The pesticide contains methoprene.  She used gloves but noted that some of the foam fell onto the gloves.  2 days ago patient use the gloves again to pick up some roses from her garden.  She forgot that there was dry pesticide on the gloves and touched the outside of the gloves with her hands.  She then drove to her daughter's kindergarten graduation.  In the car she started having itching in her eyes, redness around her lips. She kept rubbing her face.  The next day, her eyes were swollen and she treated with ice and cool compresses over her face with resolution of her symptoms yesterday evening.  This morning when she woke up she was covered in a pruritic rash involving her buttocks, the inside of her thighs, bilateral breast.  The rash is pruritic in nature, no pain, no fever.  She also felt very weak this morning.  She was afraid she was having an anaphylactic reaction as she had once in the past from an abx. No nausea, no vomiting, no chest pain, no shortness of breath, no increase in oral secretions, no lacrimation, no diarrhea, no vomiting, no fever. She also noted using a new organic soap bar 2 days ago.   Past Medical History:  Diagnosis Date  . Diverticulitis   . Hypertension     There are no active problems to display for this patient.   Past Surgical History:  Procedure Laterality Date  . FACIAL COSMETIC SURGERY  04/2003  . VARICOSE VEIN SURGERY  04/1999    Prior to Admission medications   Medication Sig  Start Date End Date Taking? Authorizing Provider  Calcium Carbonate-Vitamin D 600-400 MG-UNIT tablet Take by mouth.    [provider]  ciprofloxacin (CIPRO) 250 MG tablet Take 250 mg by mouth 2 (two) times daily. Starting 11/22/13 for 10 days 11/22/13   [provider]  clindamycin (CLEOCIN) 150 MG capsule  02/20/17   [provider]  clobetasol ointment (TEMOVATE) 6.37 % Apply 1 application topically 2 (two) times daily. 02/03/19   Rudene Re, MD  cyclobenzaprine (FLEXERIL) 5 MG tablet cyclobenzaprine 5 mg tablet  Take 1 tablet every day by oral route at bedtime.    [provider]  diazepam (VALIUM) 2 MG tablet Take 0.5 mg by mouth.    [provider]  diclofenac sodium (VOLTAREN) 1 % GEL Apply topically. 11/23/15   [provider]  diphenhydrAMINE (BENADRYL) 50 MG tablet Take 1 tablet (50 mg total) by mouth at bedtime as needed for itching. 02/03/19   Rudene Re, MD  latanoprost (XALATAN) 0.005 % ophthalmic solution Place 1 drop into both eyes at bedtime. 11/07/13   [provider]  losartan-hydrochlorothiazide (HYZAAR) 100-12.5 MG per tablet Take 1 tablet by mouth daily. 09/26/13   [provider]  meloxicam (MOBIC) 15 MG tablet meloxicam 15 mg tablet    [provider]  metroNIDAZOLE (FLAGYL) 500 MG tablet Take 1 tablet by mouth 2 (two) times daily. For 10 days starting 11/22/13 11/22/13   [provider]  Omega-3 Fatty Acids (FISH OIL PO) Take by mouth.    [provider]  ondansetron (ZOFRAN ODT) 4 MG disintegrating tablet Take 1 tablet (4 mg total) by mouth every 8 (eight) hours as needed for nausea or vomiting. 11/24/13   Vanetta MuldersZackowski, Scott, MD  pantoprazole (PROTONIX) 40 MG tablet Take 40 mg by mouth daily. 11/22/13   [provider]  PLASMA PROTEIN FRACTION IV Take by mouth.    [provider]  promethazine (PHENERGAN) 25 MG tablet Take 1 tablet (25 mg total) by mouth  every 6 (six) hours as needed for nausea or vomiting. 11/24/13   Vanetta MuldersZackowski, Scott, MD  sucralfate (CARAFATE) 1 G tablet Take 1 g by mouth 4 (four) times daily. 11/22/13   [provider]  valACYclovir (VALTREX) 1000 MG tablet valacyclovir 1 gram tablet    [provider]  zolpidem (AMBIEN) 10 MG tablet Take 5 mg by mouth at bedtime. 10/20/13   [provider]    Allergies Cephalexin, Articaine-epinephrine, Augmentin [amoxicillin-pot clavulanate], Darvon [propoxyphene], Epinephrine, Erythromycin, and Parafon forte dsc [chlorzoxazone]  Family History  Problem Relation Age of Onset  . Prostate cancer Neg Hx   . Bladder Cancer Neg Hx   . Kidney cancer Neg Hx     Social History Social History   Tobacco Use  . Smoking status: Never Smoker  . Smokeless tobacco: Never Used  Substance Use Topics  . Alcohol use: Yes  . Drug use: Not on file    Review of Systems  Constitutional: Negative for fever. + Generalized weak Eyes: Negative for visual changes. ENT: Negative for sore throat. Neck: No neck pain  Cardiovascular: Negative for chest pain. Respiratory: Negative for shortness of breath. Gastrointestinal: Negative for abdominal pain, vomiting or diarrhea. Genitourinary: Negative for dysuria. Musculoskeletal: Negative for back pain. Skin: + rash. Neurological: Negative for headaches, weakness or numbness. Psych: No SI or HI  ____________________________________________   PHYSICAL EXAM:  VITAL SIGNS: ED Triage Vitals [02/03/19 0514]  Enc Vitals Group     BP (!) 201/100     Pulse Rate 73     Resp 18     Temp 98.8 F (37.1 C)     Temp Source Oral     SpO2 98 %     Weight      Height      Head Circumference      Peak Flow      Pain Score      Pain Loc      Pain Edu?      Excl. in GC?     Constitutional: Alert and oriented. Well appearing and in no apparent distress. HEENT:      Head: Normocephalic and atraumatic.         Eyes: Conjunctivae  are normal. Sclera is non-icteric.       Mouth/Throat: Mucous membranes are moist.       Neck: Supple with no signs of meningismus. Cardiovascular: Regular rate and rhythm. No murmurs, gallops, or rubs. 2+ symmetrical distal pulses are present in all extremities. No JVD. Respiratory: Normal respiratory effort. Lungs are clear to auscultation bilaterally. No wheezes, crackles, or rhonchi.  Gastrointestinal: Soft, non tender, and non distended with positive bowel sounds. No rebound or guarding. Musculoskeletal: Nontender with normal range of motion in all extremities. No edema, cyanosis, or erythema of extremities. Neurologic: Normal speech and language. Face is symmetric. Moving all extremities. No gross focal neurologic deficits are appreciated. Skin: Skin is warm, dry  and intact. Wheals located on the inner thighs, bilateral breasts, and bilateral buttock. No mucosal involvement.  Psychiatric: Mood and affect are normal. Speech and behavior are normal.  ____________________________________________   LABS (all labs ordered are listed, but only abnormal results are displayed)  Labs Reviewed  BASIC METABOLIC PANEL - Abnormal; Notable for the following components:      Result Value   Potassium 3.2 (*)    Glucose, Bld 112 (*)    All other components within normal limits  CBC - Abnormal; Notable for the following components:   RBC 5.16 (*)    Hemoglobin 15.1 (*)    All other components within normal limits  HEPATIC FUNCTION PANEL  URINALYSIS, COMPLETE (UACMP) WITH MICROSCOPIC  CBG MONITORING, ED  TROPONIN I (HIGH SENSITIVITY)   ____________________________________________  EKG  ED ECG REPORT I, Nita Sicklearolina Theresia Pree, the attending physician, personally viewed and interpreted this ECG.  Normal sinus rhythm, rate of 77, normal intervals, normal axis, no ST elevations, minimal ST depressions on inferior leads.  No prior for comparison. ____________________________________________   RADIOLOGY  none  ____________________________________________   PROCEDURES  Procedure(s) performed: None Procedures Critical Care performed:  None ____________________________________________   INITIAL IMPRESSION / ASSESSMENT AND PLAN / ED COURSE  73 y.o. female history of hypertension who presents for evaluation of a rash and weakness.  Rash is consistent with contact dermatitis most likely from exposure to a pesticide a few days ago.  Also with a new soap although that is less likely.  No mucosal involvement, no fever, no systemic signs.  Patient is well-appearing.  She is hypertensive.  Has not taken her antihypertensive meds this morning.  Will provide her with a dose here.  Will start patient on topical clobetasol for contact dermatitis and Benadryl for itching. BP trending down after meds given. Mild hypokalemia, supplemented PO.  Discussed close follow-up with PCP and my standard return precautions.       As part of my medical decision making, I reviewed the following data within the electronic MEDICAL RECORD NUMBER Nursing notes reviewed and incorporated, Labs reviewed , EKG interpreted , Old chart reviewed, Notes from prior ED visits and  Controlled Substance Database   Patient was evaluated in Emergency Department today for the symptoms described in the history of present illness. Patient was evaluated in the context of the global COVID-19 pandemic, which necessitated consideration that the patient might be at risk for infection with the SARS-CoV-2 virus that causes COVID-19. Institutional protocols and algorithms that pertain to the evaluation of patients at risk for COVID-19 are in a state of rapid change based on information released by regulatory bodies including the CDC and federal and state organizations. These policies and algorithms were followed during the patient's care in the ED.   ____________________________________________   FINAL CLINICAL IMPRESSION(S) / ED  DIAGNOSES   Final diagnoses:  Irritant contact dermatitis due to other chemical products  Generalized weakness  Hypokalemia      NEW MEDICATIONS STARTED DURING THIS VISIT:  ED Discharge Orders         Ordered    clobetasol ointment (TEMOVATE) 0.05 %  2 times daily     02/03/19 0641    diphenhydrAMINE (BENADRYL) 50 MG tablet  At bedtime PRN     02/03/19 0641           Note:  This document was prepared using Dragon voice recognition software and may include unintentional dictation errors.    Nita SickleVeronese, [place], MD 02/03/19 [phone]

## 2019-10-06 ENCOUNTER — Other Ambulatory Visit: Payer: Self-pay

## 2019-10-06 ENCOUNTER — Emergency Department
Admission: EM | Admit: 2019-10-06 | Discharge: 2019-10-06 | Disposition: A | Discharge status: Home / Self Care | Payer: Medicare Other | Attending: Emergency Medicine | Admitting: Emergency Medicine

## 2019-10-06 DIAGNOSIS — R519 Headache, unspecified: Secondary | ICD-10-CM | POA: Diagnosis not present

## 2019-10-06 DIAGNOSIS — Z79899 Other long term (current) drug therapy: Secondary | ICD-10-CM | POA: Insufficient documentation

## 2019-10-06 DIAGNOSIS — R202 Paresthesia of skin: Secondary | ICD-10-CM | POA: Diagnosis not present

## 2019-10-06 DIAGNOSIS — I10 Essential (primary) hypertension: Secondary | ICD-10-CM | POA: Diagnosis present

## 2019-10-06 MED ORDER — CLONIDINE HCL 0.1 MG PO TABS: 0.1000 mg | ORAL_TABLET | Freq: Two times a day (BID) | ORAL | 0 refills | Status: DC | PRN

## 2019-10-06 MED ORDER — CLONIDINE HCL 0.1 MG PO TABS
0.1000 mg | ORAL_TABLET | Freq: Once | ORAL | Status: AC
  Administered 2019-10-06: 15:00:00 0.1 mg via ORAL
  Filled 2019-10-06: qty 1

## 2019-10-06 NOTE — Discharge Instructions (Signed)
Purchase a blood pressure cuff when you go to your pharmacy to get your medication.  Record your blood pressures for the next few days.  Follow up with your primary care provider for blood pressure recheck and discuss medication changes.   Return to the ER for symptoms of concern if unable to see primary care.

## 2019-10-06 NOTE — ED Triage Notes (Signed)
Pt reports she received covid vaccine and then became hypertensive - covid vaccine nurse called EMS - pt reports that normally her BP is normal - Pt denies any other symptoms at this time

## 2019-10-06 NOTE — ED Provider Notes (Signed)
[facility] Emergency Department Provider Note ____________________________________________   First MD Initiated Contact with Patient 10/06/19 1408     (approximate)  I have reviewed the triage vital signs and the nursing notes.   HISTORY  Chief Complaint Hypertension  HPI Martha Nguyen is a 74 y.o. female presenting to the emergency department for treatment and evaluation after having a hypertensive episode after her COVID-19 vaccination today.  She does have a history of hypertension.  She denies chest pain, or shortness of breath.  She does state that she has had some tingling in both hands.     Past Medical History:  Diagnosis Date  . Diverticulitis   . Hypertension     There are no problems to display for this patient.   Past Surgical History:  Procedure Laterality Date  . FACIAL COSMETIC SURGERY  04/2003  . VARICOSE VEIN SURGERY  04/1999    Prior to Admission medications   Medication Sig Start Date End Date Taking? Authorizing Provider  Calcium Carbonate-Vitamin D 600-400 MG-UNIT tablet Take by mouth.    [provider]  clobetasol ointment (TEMOVATE) 0.05 % Apply 1 application topically 2 (two) times daily. 02/03/19   Nita Sickle, MD  cloNIDine (CATAPRES) 0.1 MG tablet Take 1 tablet (0.1 mg total) by mouth 2 (two) times daily as needed for up to 7 days. 10/06/19 10/13/19  Caitlyn Buchanan, Kasandra Knudsen, FNP  cyclobenzaprine (FLEXERIL) 5 MG tablet cyclobenzaprine 5 mg tablet  Take 1 tablet every day by oral route at bedtime.    [provider]  diazepam (VALIUM) 2 MG tablet Take 0.5 mg by mouth.    [provider]  diclofenac sodium (VOLTAREN) 1 % GEL Apply topically. 11/23/15   [provider]  diphenhydrAMINE (BENADRYL) 50 MG tablet Take 1 tablet (50 mg total) by mouth at bedtime as needed for itching. 02/03/19   Nita Sickle, MD  latanoprost (XALATAN) 0.005 % ophthalmic solution Place 1 drop into both eyes  at bedtime. 11/07/13   [provider]  losartan-hydrochlorothiazide (HYZAAR) 100-12.5 MG per tablet Take 1 tablet by mouth daily. 09/26/13   [provider]  meloxicam (MOBIC) 15 MG tablet meloxicam 15 mg tablet    [provider]  metroNIDAZOLE (FLAGYL) 500 MG tablet Take 1 tablet by mouth 2 (two) times daily. For 10 days starting 11/22/13 11/22/13   [provider]  Omega-3 Fatty Acids (FISH OIL PO) Take by mouth.    [provider]  ondansetron (ZOFRAN ODT) 4 MG disintegrating tablet Take 1 tablet (4 mg total) by mouth every 8 (eight) hours as needed for nausea or vomiting. 11/24/13   Vanetta Mulders, MD  pantoprazole (PROTONIX) 40 MG tablet Take 40 mg by mouth daily. 11/22/13   [provider]  PLASMA PROTEIN FRACTION IV Take by mouth.    [provider]  promethazine (PHENERGAN) 25 MG tablet Take 1 tablet (25 mg total) by mouth every 6 (six) hours as needed for nausea or vomiting. 11/24/13   Vanetta Mulders, MD  sucralfate (CARAFATE) 1 G tablet Take 1 g by mouth 4 (four) times daily. 11/22/13   [provider]  valACYclovir (VALTREX) 1000 MG tablet valacyclovir 1 gram tablet    [provider]  zolpidem (AMBIEN) 10 MG tablet Take 5 mg by mouth at bedtime. 10/20/13   [provider]    Allergies Cephalexin, Articaine-epinephrine, Augmentin [amoxicillin-pot clavulanate], Darvon [propoxyphene], Epinephrine hcl (nasal), Erythromycin, and Parafon forte dsc [chlorzoxazone]  Family History  Problem Relation Age of Onset  . Prostate cancer Neg Hx   . Bladder Cancer Neg Hx   . Kidney cancer Neg Hx     Social History Social History   Tobacco Use  . Smoking status: Never Smoker  . Smokeless tobacco: Never Used  Substance Use Topics  . Alcohol use: Yes  . Drug use: Never    Review of Systems  Constitutional: No fever/chills Eyes: No visual changes. ENT: No sore throat. Cardiovascular: Denies chest  pain. Respiratory: Denies shortness of breath. Gastrointestinal: No abdominal pain. No nausea, no vomiting.  No diarrhea.  No constipation. Genitourinary: Negative for dysuria. Musculoskeletal: Negative for back pain. Skin: Negative for rash. Neurological: Positive for headaches, focal weakness or numbness. ____________________________________________   PHYSICAL EXAM:  VITAL SIGNS: ED Triage Vitals  Enc Vitals Group     BP 10/06/19 1223 (!) 207/90     Pulse Rate 10/06/19 1223 74     Resp 10/06/19 1223 15     Temp 10/06/19 1223 97.8 F (36.6 C)     Temp Source 10/06/19 1223 Oral     SpO2 10/06/19 1223 100 %     Weight 10/06/19 1224 130 lb (59 kg)     Height 10/06/19 1224 5\' 1"  (1.549 m)     Head Circumference --      Peak Flow --      Pain Score 10/06/19 1224 0     Pain Loc --      Pain Edu? --      Excl. in GC? --     Constitutional: Alert and oriented. Well appearing and in no acute distress. Eyes: Conjunctivae are normal. Head: Atraumatic. Nose: No congestion/rhinnorhea. Mouth/Throat: Mucous membranes are moist. Oropharynx non-erythematous. Neck: No stridor.   Hematological/Lymphatic/Immunilogical: No cervical lymphadenopathy. Cardiovascular: Normal rate, regular rhythm. Grossly normal heart sounds.  Good peripheral circulation. Respiratory: Normal respiratory effort.  No retractions. Lungs CTAB. Gastrointestinal: Soft and nontender. No distention.  Genitourinary:  Musculoskeletal: No lower extremity tenderness nor edema.  No joint effusions. Neurologic:  Normal speech and language. No gross focal neurologic deficits are appreciated. No gait instability. Skin:  Skin is warm, dry and intact. No rash noted. Psychiatric: Mood and affect are normal. Speech and behavior are normal.  ____________________________________________   LABS (all labs ordered are listed, but only abnormal results are displayed)  Labs Reviewed - No data to  display ____________________________________________  EKG  Not indicated. ____________________________________________  RADIOLOGY  Official radiology report(s): No results found.  ____________________________________________   PROCEDURES  Procedure(s) performed (including Critical Care):  Procedures  ____________________________________________   INITIAL IMPRESSION / ASSESSMENT AND PLAN     74 year old female presenting to the emergency department for treatment and evaluation after she became hypertensive after receiving her first Covid vaccination.  See HPI for further details.  At this time, the patient states that she "feels fine" but is concerned about her blood pressure being high.  She is unsure whether her hypertensive episode was directly related to the Covid vaccination.  She states that she has had some headaches over the past couple of months but has contributed to allergies.  She states that she has seasonal allergies every year around this time and typically has a headache and scratchy throat.  She has not checked her blood pressure at home.  She has taken her blood pressure medications as prescribed without missing any doses.  ED COURSE  While here, the patient received 0.1 mg of clonidine with successful reduction of her  blood pressure.  Prior to discharge she was 152/68.  She states that her headache has significantly lessened but she believes that when she gets home to get something to eat it will go away completely.  She will be given a prescription for clonidine to be taken if needed up to 2 times per day.  She plans on purchasing a blood pressure monitor at the pharmacy and will check it off and on throughout the day especially if she has any concerning symptoms.  She also plans to call and schedule follow-up appointment with her primary care provider.  She was advised that she may return to the emergency department at any point if she develops symptoms of  concern. ____________________________________________   FINAL CLINICAL IMPRESSION(S) / ED DIAGNOSES  Final diagnoses:  Hypertension, unspecified type     ED Discharge Orders         Ordered    cloNIDine (CATAPRES) 0.1 MG tablet  2 times daily PRN     10/06/19 1603           KATINA REMICK was evaluated in Emergency Department on 10/06/2019 for the symptoms described in the history of present illness. She was evaluated in the context of the global COVID-19 pandemic, which necessitated consideration that the patient might be at risk for infection with the SARS-CoV-2 virus that causes COVID-19. Institutional protocols and algorithms that pertain to the evaluation of patients at risk for COVID-19 are in a state of rapid change based on information released by regulatory bodies including the CDC and federal and state organizations. These policies and algorithms were followed during the patient's care in the ED.   Note:  This document was prepared using Dragon voice recognition software and may include unintentional dictation errors.   Victorino Dike, FNP 10/06/19 1618    Delman Kitten, MD 10/06/19 2122

## 2019-10-06 NOTE — ED Triage Notes (Signed)
First Nurse Note:  Patient sent from vaccine clinic. Patient with hypertensive episode after vaccination. Complaining of tingling in bilateral hands.

## 2019-10-06 NOTE — ED Notes (Signed)
See triage note  Presents with elevated b/p  States she became dizzy after having the COVID vaccine today  States she feels fine at present but is concerned about her b/p

## 2020-01-17 ENCOUNTER — Encounter: Payer: Self-pay | Admitting: Surgery

## 2020-01-17 ENCOUNTER — Ambulatory Visit (INDEPENDENT_AMBULATORY_CARE_PROVIDER_SITE_OTHER): Payer: Medicare Other | Admitting: Surgery

## 2020-01-17 ENCOUNTER — Other Ambulatory Visit: Payer: Self-pay

## 2020-01-17 VITALS — BP 172/101 | HR 82 | Temp 98.7°F | Ht 61.5 in | Wt 136.6 lb

## 2020-01-17 DIAGNOSIS — K4090 Unilateral inguinal hernia, without obstruction or gangrene, not specified as recurrent: Secondary | ICD-10-CM | POA: Diagnosis not present

## 2020-01-17 NOTE — Progress Notes (Signed)
Patient ID: Martha Nguyen, female   DOB: 1946-05-02, 74 y.o.   MRN: [id number]  Chief Complaint: Incisional hernia  History of Present Illness Martha Nguyen is a 74 y.o. female with a bothersome bulge that has been present for roughly over a year.  It is reportedly worse when standing for prolonged periods.  She notes the bulge when standing, which resolves spontaneously when reclining.  She has had an abdominal plasty, but no prior history of hernia repair or any intra-abdominal procedures.  She denies any associated nausea, vomiting, fevers or chills.  She reports the bulge does not stay present with changes in position.  She has had no changes to her bowel function whatsoever.  Past Medical History Past Medical History:  Diagnosis Date  . Diverticulitis   . Hypertension       Past Surgical History:  Procedure Laterality Date  . FACIAL COSMETIC SURGERY  04/2003  . VARICOSE VEIN SURGERY  04/1999    Allergies  Allergen Reactions  . Cephalexin Anaphylaxis  . Articaine-Epinephrine     Other reaction(s): Unknown Other reaction(s): Unknown   . Augmentin [Amoxicillin-Pot Clavulanate]     Anxiety, insomnia, diarrhea   . Darvon [Propoxyphene] Nausea And Vomiting  . Epinephrine Hcl (Nasal) Other (See Comments)    Increased blood pressure   . Erythromycin Other (See Comments)    Unknown   . Parafon Forte Dsc [Chlorzoxazone] Nausea And Vomiting    Current Outpatient Medications  Medication Sig Dispense Refill  . amLODipine (NORVASC) 10 MG tablet Take 10 mg by mouth daily.    . Calcium Carbonate-Vitamin D 600-400 MG-UNIT tablet Take by mouth.    . diclofenac sodium (VOLTAREN) 1 % GEL Apply topically.    . diphenhydrAMINE (BENADRYL) 50 MG tablet Take 1 tablet (50 mg total) by mouth at bedtime as needed for itching. 30 tablet 0  . escitalopram (LEXAPRO) 5 MG tablet SMARTSIG:1 Tablet(s) By Mouth Every Evening    . latanoprost (XALATAN) 0.005 % ophthalmic solution Place 1 drop  into both eyes at bedtime.    Marland Kitchen losartan-hydrochlorothiazide (HYZAAR) 100-12.5 MG per tablet Take 1 tablet by mouth daily.    . Omega-3 Fatty Acids (FISH OIL PO) Take by mouth.    . SBI/Protein Isolate (ENTERAGAM) 5 g PACK Take by mouth.    . zolpidem (AMBIEN) 10 MG tablet Take 5 mg by mouth at bedtime.    . clobetasol ointment (TEMOVATE) 0.05 % Apply 1 application topically 2 (two) times daily. (Patient not taking: Reported on 01/17/2020) 30 g 0  . cloNIDine (CATAPRES) 0.1 MG tablet Take 1 tablet (0.1 mg total) by mouth 2 (two) times daily as needed for up to 7 days. 14 tablet 0  . cyclobenzaprine (FLEXERIL) 5 MG tablet cyclobenzaprine 5 mg tablet  Take 1 tablet every day by oral route at bedtime. (Patient not taking: Reported on 01/17/2020)    . diazepam (VALIUM) 2 MG tablet Take 0.5 mg by mouth. (Patient not taking: Reported on 01/17/2020)    . metroNIDAZOLE (FLAGYL) 500 MG tablet Take 1 tablet by mouth 2 (two) times daily. For 10 days starting 11/22/13 (Patient not taking: Reported on 01/17/2020)    . ondansetron (ZOFRAN ODT) 4 MG disintegrating tablet Take 1 tablet (4 mg total) by mouth every 8 (eight) hours as needed for nausea or vomiting. (Patient not taking: Reported on 01/17/2020) 12 tablet 1  . pantoprazole (PROTONIX) 40 MG tablet Take 40 mg by mouth daily. (Patient not taking: Reported on 01/17/2020)    .  PLASMA PROTEIN FRACTION IV Take by mouth. (Patient not taking: Reported on 01/17/2020)    . promethazine (PHENERGAN) 25 MG tablet Take 1 tablet (25 mg total) by mouth every 6 (six) hours as needed for nausea or vomiting. (Patient not taking: Reported on 01/17/2020) 20 tablet 1  . sucralfate (CARAFATE) 1 G tablet Take 1 g by mouth 4 (four) times daily. (Patient not taking: Reported on 01/17/2020)    . valACYclovir (VALTREX) 1000 MG tablet valacyclovir 1 gram tablet (Patient not taking: Reported on 01/17/2020)     No current facility-administered medications for this visit.    Family  History Family History  Problem Relation Age of Onset  . Prostate cancer Neg Hx   . Bladder Cancer Neg Hx   . Kidney cancer Neg Hx       Social History Social History   Tobacco Use  . Smoking status: Never Smoker  . Smokeless tobacco: Never Used  Substance Use Topics  . Alcohol use: Yes  . Drug use: Never        Review of Systems  Constitutional: Negative.   HENT: Negative.   Eyes: Negative.   Respiratory: Negative.   Cardiovascular: Negative.   Gastrointestinal: Negative.   Genitourinary: Negative.   Skin: Negative.   Neurological: Negative.   Psychiatric/Behavioral: Negative.       Physical Exam Blood pressure (!) 172/101, pulse 82, temperature 98.7 F (37.1 C), temperature source Oral, height 5' 1.5" (1.562 m), weight 136 lb 9.6 oz (62 kg), SpO2 97 %. Last Weight  Most recent update: 01/17/2020 11:03 AM   Weight  62 kg (136 lb 9.6 oz)            CONSTITUTIONAL: Well developed, and nourished, appropriately responsive and aware without distress.   EYES: Sclera non-icteric.   EARS, NOSE, MOUTH AND THROAT: Mask worn.  Hearing is intact to voice.  NECK: Trachea is midline, and there is no jugular venous distension.  LYMPH NODES:  Lymph nodes in the neck are not enlarged. RESPIRATORY:  Lungs are clear, and breath sounds are equal bilaterally. Normal respiratory effort without pathologic use of accessory muscles. CARDIOVASCULAR: Heart is regular in rate and rhythm. GI: The abdomen is soft, nontender, and nondistended. There were no palpable masses. I did not appreciate hepatosplenomegaly. There were normal bowel sounds. GU: She has the transverse lower abdominal scar of an abdominal plasty/tummy tuck.  Within the scar on her left side there is a bulge that comes and easily goes with Valsalva.  It is consistent with a high early small inguinal hernia.  There is no appreciable defect or similar process on the right side. MUSCULOSKELETAL:  Symmetrical muscle tone  appreciated in all four extremities.    SKIN: Skin turgor is normal. No pathologic skin lesions appreciated.  NEUROLOGIC:  Motor and sensation appear grossly normal.  Cranial nerves are grossly without defect. PSYCH:  Alert and oriented to person, place and time. Affect is appropriate for situation.  Data Reviewed I have personally reviewed what is currently available of the patient's imaging, recent labs and medical records.   Labs:  CBC Latest Ref Rng & Units 02/03/2019 11/24/2013  WBC 4.0 - 10.5 K/uL 7.5 6.6  Hemoglobin 12.0 - 15.0 g/dL 15.1(H) 16.3(H)  Hematocrit 36 - 46 % 45.4 46.3(H)  Platelets 150 - 400 K/uL 206 211   CMP Latest Ref Rng & Units 02/03/2019 10/17/2016 11/24/2013  Glucose 70 - 99 mg/dL 825(K) - 539(J)  BUN 8 - 23 mg/dL 18 -  5(L)  Creatinine 0.44 - 1.00 mg/dL 3.29 5.18 8.41  Sodium 135 - 145 mmol/L 140 - 134(L)  Potassium 3.5 - 5.1 mmol/L 3.2(L) - 3.4(L)  Chloride 98 - 111 mmol/L 104 - 94(L)  CO2 22 - 32 mmol/L 25 - 26  Calcium 8.9 - 10.3 mg/dL 9.5 - 9.8  Total Protein 6.5 - 8.1 g/dL 7.7 - 8.0  Total Bilirubin 0.3 - 1.2 mg/dL 0.9 - 0.7  Alkaline Phos 38 - 126 U/L 67 - 77  AST 15 - 41 U/L 20 - 27  ALT 0 - 44 U/L 11 - 16      Imaging:  Within last 24 hrs: No results found.  Assessment    Left inguinal hernia, mildly symptomatic. There are no problems to display for this patient.   Plan    We had a hernia discussion, regarding methods/manner of repair.  What would develop greater concern, with increasing size, pain or tenderness.  Obviously when it affects her activities of daily living or limits her from doing things she enjoys her wants to do.  She is well aware that it always reduces when she lays down, we discussed the need for reduction should it become painful and the bulge be persistent.  I believe she is well aware of all of this and desires to defer elective surgery at this time.  We will gladly see her again should she decide she wishes to pursue  robotic inguinal hernia repair.  Is been a delight to meet this patient and to answer all her questions.  Face-to-face time spent with the patient and accompanying care providers(if present) was 30 minutes, with more than 50% of the time spent counseling, educating, and coordinating care of the patient.      Campbell Lerner M.D., FACS 01/17/2020, 11:49 AM

## 2020-01-17 NOTE — Patient Instructions (Addendum)
Dr.Rodenberg discussed the surgery and risk factors of the surgery with patient at today's visit.  Patient will contact our office if or when she decides she would like to proceed with surgery. Patient was advised she may continue with her daily exercise routines. Inguinal Hernia, Adult An inguinal hernia develops when fat or the intestines push through a weak spot in a muscle where your leg meets your lower abdomen (groin). This creates a bulge. This kind of hernia could also be:  In your scrotum, if you are female.  In folds of skin around your vagina, if you are female. There are three types of inguinal hernias:  Hernias that can be pushed back into the abdomen (are reducible). This type rarely causes pain.  Hernias that are not reducible (are incarcerated).  Hernias that are not reducible and lose their blood supply (are strangulated). This type of hernia requires emergency surgery. What are the causes? This condition is caused by having a weak spot in the muscles or tissues in the groin. This weak spot develops over time. The hernia may poke through the weak spot when you suddenly strain your lower abdominal muscles, such as when you:  Lift a heavy object.  Strain to have a bowel movement. Constipation can lead to straining.  Cough. What increases the risk? This condition is more likely to develop in:  Men.  Pregnant women.  People who: ? Are overweight. ? Work in jobs that require long periods of standing or heavy lifting. ? Have had an inguinal hernia before. ? Smoke or have lung disease. These factors can lead to long-lasting (chronic) coughing. What are the signs or symptoms? Symptoms may depend on the size of the hernia. Often, a small inguinal hernia has no symptoms. Symptoms of a larger hernia may include:  A lump in the groin area. This is easier to see when standing. It might not be visible when lying down.  Pain or burning in the groin. This may get worse when  lifting, straining, or coughing.  A dull ache or a feeling of pressure in the groin.  In men, an unusual lump in the scrotum. Symptoms of a strangulated inguinal hernia may include:  A bulge in your groin that is very painful and tender to the touch.  A bulge that turns red or purple.  Fever, nausea, and vomiting.  Inability to have a bowel movement or to pass gas. How is this diagnosed? This condition is diagnosed based on your symptoms, your medical history, and a physical exam. Your health care provider may feel your groin area and ask you to cough. How is this treated? Treatment depends on the size of your hernia and whether you have symptoms. If you do not have symptoms, your health care provider may have you watch your hernia carefully and have you come in for follow-up visits. If your hernia is large or if you have symptoms, you may need surgery to repair the hernia. Follow these instructions at home: Lifestyle  Avoid lifting heavy objects.  Avoid standing for long periods of time.  Do not use any products that contain nicotine or tobacco, such as cigarettes and e-cigarettes. If you need help quitting, ask your health care provider.  Maintain a healthy weight. Preventing constipation  Take actions to prevent constipation. Constipation leads to straining with bowel movements, which can make a hernia worse or cause a hernia repair to break down. Your health care provider may recommend that you: ? Drink enough fluid to  keep your urine pale yellow. ? Eat foods that are high in fiber, such as fresh fruits and vegetables, whole grains, and beans. ? Limit foods that are high in fat and processed sugars, such as fried or sweet foods. ? Take an over-the-counter or prescription medicine for constipation. General instructions  You may try to push the hernia back in place by very gently pressing on it while lying down. Do not try to force the bulge back in if it will not push in  easily.  Watch your hernia for any changes in shape, size, or color. Get help right away if you notice any changes.  Take over-the-counter and prescription medicines only as told by your health care provider.  Keep all follow-up visits as told by your health care provider. This is important. Contact a health care provider if:  You have a fever.  You develop new symptoms.  Your symptoms get worse. Get help right away if:  You have pain in your groin that suddenly gets worse.  You have a bulge in your groin that: ? Suddenly gets bigger and does not get smaller. ? Becomes red or purple or painful to the touch.  You are a man and you have a sudden pain in your scrotum, or the size of your scrotum suddenly changes.  You cannot push the hernia back in place by very gently pressing on it when you are lying down. Do not try to force the bulge back in if it will not push in easily.  You have nausea or vomiting that does not go away.  You have a fast heartbeat.  You cannot have a bowel movement or pass gas. These symptoms may represent a serious problem that is an emergency. Do not wait to see if the symptoms will go away. Get medical help right away. Call your local emergency services (911 in the [place] Summary  An inguinal hernia develops when fat or the intestines push through a weak spot in a muscle where your leg meets your lower abdomen (groin).  This condition is caused by having a weak spot in muscles or tissue in your groin.  Symptoms may depend on the size of the hernia, and they may include pain or swelling in your groin. A small inguinal hernia often has no symptoms.  Treatment may not be needed if you do not have symptoms. If you have symptoms or a large hernia, you may need surgery to repair the hernia.  Avoid lifting heavy objects. Also avoid standing for long amounts of time. This information is not intended to replace advice given to you by your health care provider.  Make sure you discuss any questions you have with your health care provider. Document Revised: 07/25/2017 Document Reviewed: 03/25/2017 Elsevier Patient Education  2020 [employer].

## 2022-04-08 ENCOUNTER — Other Ambulatory Visit: Payer: Self-pay | Admitting: Unknown Physician Specialty

## 2022-04-08 DIAGNOSIS — E041 Nontoxic single thyroid nodule: Secondary | ICD-10-CM

## 2022-04-10 ENCOUNTER — Ambulatory Visit
Admission: RE | Admit: 2022-04-10 | Discharge: 2022-04-10 | Disposition: A | Discharge status: Home / Self Care | Payer: Medicare Other | Source: Ambulatory Visit | Attending: Unknown Physician Specialty | Admitting: Unknown Physician Specialty

## 2022-04-10 DIAGNOSIS — E041 Nontoxic single thyroid nodule: Secondary | ICD-10-CM

## 2023-10-29 ENCOUNTER — Other Ambulatory Visit: Payer: Self-pay | Admitting: Obstetrics and Gynecology

## 2023-10-29 DIAGNOSIS — R1900 Intra-abdominal and pelvic swelling, mass and lump, unspecified site: Secondary | ICD-10-CM

## 2023-11-04 ENCOUNTER — Ambulatory Visit
Admission: RE | Admit: 2023-11-04 | Discharge: 2023-11-04 | Disposition: A | Discharge status: Home / Self Care | Payer: No Typology Code available for payment source | Source: Ambulatory Visit | Attending: Obstetrics and Gynecology | Admitting: Obstetrics and Gynecology

## 2023-11-04 DIAGNOSIS — R1900 Intra-abdominal and pelvic swelling, mass and lump, unspecified site: Secondary | ICD-10-CM

## 2023-11-26 ENCOUNTER — Emergency Department

## 2023-11-26 ENCOUNTER — Other Ambulatory Visit: Payer: Self-pay

## 2023-11-26 ENCOUNTER — Emergency Department: Admission: EM | Admit: 2023-11-26 | Discharge: 2023-11-26 | Disposition: A | Discharge status: Home / Self Care

## 2023-11-26 DIAGNOSIS — R202 Paresthesia of skin: Secondary | ICD-10-CM | POA: Diagnosis not present

## 2023-11-26 DIAGNOSIS — I10 Essential (primary) hypertension: Secondary | ICD-10-CM | POA: Insufficient documentation

## 2023-11-26 DIAGNOSIS — I6782 Cerebral ischemia: Secondary | ICD-10-CM | POA: Diagnosis not present

## 2023-11-26 DIAGNOSIS — R200 Anesthesia of skin: Secondary | ICD-10-CM | POA: Diagnosis present

## 2023-11-26 DIAGNOSIS — M4322 Fusion of spine, cervical region: Secondary | ICD-10-CM | POA: Insufficient documentation

## 2023-11-26 DIAGNOSIS — M5412 Radiculopathy, cervical region: Secondary | ICD-10-CM | POA: Diagnosis not present

## 2023-11-26 LAB — DIFFERENTIAL
Abs Immature Granulocytes: 0.01 10*3/uL (ref 0.00–0.07)
Basophils Absolute: 0.1 10*3/uL (ref 0.0–0.1)
Basophils Relative: 1 %
Eosinophils Absolute: 0.2 10*3/uL (ref 0.0–0.5)
Eosinophils Relative: 4 %
Immature Granulocytes: 0 %
Lymphocytes Relative: 25 %
Lymphs Abs: 1.7 10*3/uL (ref 0.7–4.0)
Monocytes Absolute: 0.6 10*3/uL (ref 0.1–1.0)
Monocytes Relative: 10 %
Neutro Abs: 4 10*3/uL (ref 1.7–7.7)
Neutrophils Relative %: 60 %

## 2023-11-26 LAB — COMPREHENSIVE METABOLIC PANEL WITH GFR
ALT: 11 U/L (ref 0–44)
AST: 19 U/L (ref 15–41)
Albumin: 4.8 g/dL (ref 3.5–5.0)
Alkaline Phosphatase: 68 U/L (ref 38–126)
Anion gap: 13 (ref 5–15)
BUN: 13 mg/dL (ref 8–23)
CO2: 27 mmol/L (ref 22–32)
Calcium: 9.9 mg/dL (ref 8.9–10.3)
Chloride: 96 mmol/L — ABNORMAL LOW (ref 98–111)
Creatinine, Ser: 0.64 mg/dL (ref 0.44–1.00)
GFR, Estimated: >60 mL/min (ref >60)
Glucose, Bld: 95 mg/dL (ref 70–99)
Potassium: 3.6 mmol/L (ref 3.5–5.1)
Sodium: 136 mmol/L (ref 135–145)
Total Bilirubin: 1.1 mg/dL (ref 0.0–1.2)
Total Protein: 7.9 g/dL (ref 6.5–8.1)

## 2023-11-26 LAB — CBC
HCT: 45.1 % (ref 36.0–46.0)
Hemoglobin: 15.1 g/dL — ABNORMAL HIGH (ref 12.0–15.0)
MCH: 30.3 pg (ref 26.0–34.0)
MCHC: 33.5 g/dL (ref 30.0–36.0)
MCV: 90.6 fL (ref 80.0–100.0)
Platelets: 221 10*3/uL (ref 150–400)
RBC: 4.98 MIL/uL (ref 3.87–5.11)
RDW: 13 % (ref 11.5–15.5)
WBC: 6.6 10*3/uL (ref 4.0–10.5)
nRBC: 0 % (ref 0.0–0.2)

## 2023-11-26 LAB — TROPONIN I (HIGH SENSITIVITY): Troponin I (High Sensitivity): 3 ng/L (ref <18)

## 2023-11-26 LAB — PROTIME-INR
INR: 1.1 (ref 0.8–1.2)
Prothrombin Time: 14.2 s (ref 11.4–15.2)

## 2023-11-26 LAB — ETHANOL: Alcohol, Ethyl (B): <15 mg/dL (ref <15)

## 2023-11-26 LAB — APTT: aPTT: 28 s (ref 24–36)

## 2023-11-26 LAB — CBG MONITORING, ED: Glucose-Capillary: 88 mg/dL (ref 70–99)

## 2023-11-26 MED ORDER — LIDOCAINE 5 % EX PTCH: 1.0000 | MEDICATED_PATCH | CUTANEOUS | Status: DC

## 2023-11-26 MED ORDER — ACETAMINOPHEN 325 MG PO TABS: 650.0000 mg | ORAL_TABLET | ORAL | 2 refills | Status: DC | PRN

## 2023-11-26 MED ORDER — ACETAMINOPHEN 500 MG PO TABS: 1000.0000 mg | ORAL_TABLET | Freq: Once | ORAL | Status: DC

## 2023-11-26 MED ORDER — LIDOCAINE 5 % EX PTCH: 1.0000 | MEDICATED_PATCH | CUTANEOUS | 0 refills | Status: AC

## 2023-11-26 MED ORDER — GABAPENTIN 100 MG PO CAPS: 100.0000 mg | ORAL_CAPSULE | Freq: Three times a day (TID) | ORAL | 0 refills | Status: DC

## 2023-11-26 NOTE — ED Notes (Signed)
 Pt verbalizes understanding of discharge instructions. Opportunity for questioning and answers were provided. Pt discharged from ED to home by self.

## 2023-11-26 NOTE — ED Provider Notes (Signed)
 [facility] Provider Note    Event Date/Time   First MD Initiated Contact with Patient 11/26/23 1129     (approximate)   History   Numbness  Pt to ED via POV from [facility]. Pt ambulatory to triage. Pt reports right arm numbness that started on 5/16. No injury. No pain or tingling. Denies vision changes, speech changes or HA. Pt reports numbness moving to left arm yesterday. Hx of pinch nerve on left.    HPI Martha Nguyen is a 78 y.o. female PMH hypertension, hyperlipidemia, depression presents from clinic for evaluation of right arm numbness x 6-7 days as well as transient left upper extremity numbness - Right upper extremity numbness through mid upper arm down through palm present since 11/19/2020.  Waxing and waning in severity throughout the day though constant.  Since yesterday has had intermittent sensation of left arm numbness as well.  No neck pain, no worsening of symptoms with movement of neck left or right. - No chest pain or shortness of breath       Physical Exam   Triage Vital Signs: ED Triage Vitals  Encounter Vitals Group     BP 11/26/23 1012 (!) 178/81     Systolic BP Percentile --      Diastolic BP Percentile --      Pulse Rate 11/26/23 1012 78     Resp 11/26/23 1012 20     Temp 11/26/23 1012 98.1 F (36.7 C)     Temp Source 11/26/23 1012 Oral     SpO2 11/26/23 1012 100 %     Weight --      Height --      Head Circumference --      Peak Flow --      Pain Score 11/26/23 1009 0     Pain Loc --      Pain Education --      Exclude from Growth Chart --     Most recent vital signs: Vitals:   11/26/23 1012 11/26/23 1233  BP: (!) 178/81 (!) 166/66  Pulse: 78 67  Resp: 20 18  Temp: 98.1 F (36.7 C)   SpO2: 100% 100%     General: Awake, no distress.  CV:  Good peripheral perfusion. RRR, RP 2+ Resp:  Normal effort. CTAB Abd:  No distention. Nontender to deep palpation throughout BUE:  Full range of motion all joints, no  rashes, normal strength and sensation, RP 2+ Neck:  Full rom, no midline ttp Neuro:  Aox4, CN II-XII intact, FNF wnl, finger taps fast b/l, 5/5 strength in bilateral finger extension/grip, arm flexion/extension, EHL/FHL. BUE AG 10+ sec no drift, BLE AG 5+ sec no drift. Ambulates with steady gait. SILT except for subjectively reduced sensation throughout right upper extremity. Negative Rhomberg.    ED Results / Procedures / Treatments   Labs (all labs ordered are listed, but only abnormal results are displayed) Labs Reviewed  CBC - Abnormal; Notable for the following components:      Result Value   Hemoglobin 15.1 (*)    All other components within normal limits  COMPREHENSIVE METABOLIC PANEL WITH GFR - Abnormal; Notable for the following components:   Chloride 96 (*)    All other components within normal limits  PROTIME-INR  APTT  DIFFERENTIAL  ETHANOL  CBG MONITORING, ED  TROPONIN I (HIGH SENSITIVITY)     EKG  See ED course below.   RADIOLOGY Radiology interpreted by myself and radiology reports reviewed.  MRI report pending.  PROCEDURES:  Critical Care performed: No  Procedures   MEDICATIONS ORDERED IN ED: Medications  acetaminophen (TYLENOL) tablet 1,000 mg (1,000 mg Oral Patient Refused/Not Given 11/26/23 1500)  lidocaine (LIDODERM) 5 % 1 patch (1 patch Transdermal Patient Refused/Not Given 11/26/23 1459)     IMPRESSION / MDM / ASSESSMENT AND PLAN / ED COURSE  I reviewed the triage vital signs and the nursing notes.                              DDX/MDM/AP: Differential diagnosis includes, but is not limited to, CVA versus cervical radiculopathy.  Doubt electrolyte abnormality.  Doubt anginal equivalent.  Plan: - Labs - EKG - CT head   Patient's presentation is most consistent with acute presentation with potential threat to life or bodily function.   ED course below.  Laboratory workup and EKG unremarkable, CT head with no acute pathology.  Patient  with persistent right upper extremity numbness given-escalated to MRI.  If negative, most consistent with a cervical radiculopathy and would recommend Tylenol, Lidoderm, gabapentin with plan for outpatient follow-up.  Signed out to oncoming ED provider pending results of MRI.  Clinical Course as of 11/26/23 1535  Thu Nov 26, 2023  1143 Cbc, bmp  reviewed, unremarkable Glucose wnl [MM]  1237 Delayed entry Initial EKG reading possible STEMI given isolated elevation in V2.  No reciprocal depressions on my interpretation.  Repeat EKG appears very similar but is not read as STEMI by machine.  Patient with no chest pain and symptoms of extremity numbness have been ongoing for about 1 week, will defer STEMI activation at this time and follow troponin which is in process. [MM]  1311 Trop 3, not c/w ACS.  [MM]  1332 CTH: IMPRESSION: No CT evidence of acute intracranial abnormality.  Mild chronic microvascular ischemic changes. Mild parenchymal volume loss.   [MM]    Clinical Course User Index [MM] Collis Deaner, MD     FINAL CLINICAL IMPRESSION(S) / ED DIAGNOSES   Final diagnoses:  Paresthesia  Cervical radiculopathy     Rx / DC Orders   ED Discharge Orders          Ordered    acetaminophen (TYLENOL) 325 MG tablet  Every 4 hours PRN        11/26/23 1532    lidocaine (LIDODERM) 5 %  Every 24 hours        11/26/23 1532    gabapentin (NEURONTIN) 100 MG capsule  3 times daily        11/26/23 1532             Note:  This document was prepared using Dragon voice recognition software and may include unintentional dictation errors.   Collis Deaner, MD 11/26/23 1535

## 2023-11-26 NOTE — ED Triage Notes (Addendum)
 Pt to ED via POV from [facility]. Pt ambulatory to triage. Pt reports right arm numbness that started on 5/16. No injury. No pain or tingling. Denies vision changes, speech changes or HA. Pt reports numbness moving to left arm yesterday. Hx of pinch nerve on left.

## 2023-11-26 NOTE — ED Notes (Signed)
 Called lab to add on trop

## 2023-11-26 NOTE — Discharge Instructions (Signed)
 Your evaluation in the emergency department was reassuring, and we saw no evidence of underlying stroke.  I recommend using Tylenol, Lidoderm patches, and gabapentin as needed for any ongoing discomfort.  Please do follow-up with your primary care provider for reevaluation, and return to the emergency department with any new or worsening symptoms.

## 2024-01-20 ENCOUNTER — Ambulatory Visit: Payer: Self-pay | Admitting: Surgery

## 2024-01-20 NOTE — H&P (View-Only) (Signed)
 Subjective:   CC: Non-recurrent bilateral inguinal hernia without obstruction or gangrene [K40.20]  HPI: referred by Martha Nguyen* for evaluation of above.   History of Present Illness Martha Nguyen is a 78 year old female who presents with bilateral hernias for surgical evaluation.  Three months ago, she noticed a hernia on her left side, which has increased in size. The hernias are not painful and can be reduced manually. She is concerned about the impact on her active lifestyle, which includes lifting and working out at [employer], and is eager for surgery to resume activities without restrictions. Her surgical history includes only varicose vein removal, with no surgeries in the hernia area.   Past Medical History:  has a past medical history of Anxiety, Cat allergies, Depression, Fracture, Head ache, Headache, temporal (10/07/2016), History of cataract, Hyperlipidemia, Hypertension, Inguinal hernia, Insomnia, Ocular hypertension, Osteoarthritis, Pelvic pain in female, and Thickened endometrium.  Past Surgical History:  Past Surgical History:  Procedure Laterality Date   EXCISION FINGER BONE CYST/TUMOR Right 09/27/2008   cancer on nose  07/15/2018   face lift     vein stripping     wisdom teeth      Family History: family history includes Brain hemorrhage in her father; Dementia in her maternal grandmother and mother; Diabetes type II in her brother; Lung cancer in her father; Stroke in her father.  Social History:  reports that she has never smoked. She has never used smokeless tobacco. She reports that she does not currently use alcohol. She reports that she does not use drugs.  Current Medications: has a current medication list which includes the following prescription(s): acetaminophen , amlodipine, ammonium lactate, ascorbic acid (vitamin c), cholecalciferol, clobetasol , escitalopram oxalate, hyaluronate sodium, lactobacillus acidophilus, latanoprost,  losartan -hydrochlorothiazide , omega-3 fatty acids/fish oil, vit c/e/zn/coppr/lutein/zeaxan, zolpidem, and gabapentin .  Allergies:  Allergies as of 01/20/2024 - Reviewed 01/20/2024  Allergen Reaction Noted   Flagyl [metronidazole] Swelling 08/04/2023   Keflex [cephalexin] Shortness Of Breath 11/04/2013   Epinephrine Palpitations 11/24/2013   Septocaine [articaine-epinephrine] Other (See Comments) 11/04/2013   Amoxicillin-pot clavulanate Diarrhea and Anxiety 07/13/2014   Darvocet a500 [propoxyphene n-acetaminophen ] Nausea 11/04/2013   Darvon [propoxyphene] Nausea 11/04/2013   Erythromycin Nausea and Abdominal Pain 07/13/2014   Parafon forte [chlorzoxazone] Vomiting 11/04/2013    ROS:  A 15 point review of systems was performed and pertinent positives and negatives noted in HPI   Objective:     BP (!) 153/75   Pulse 66   Ht 156.2 cm (5' 1.5)   Wt 60.8 kg (134 lb)   BMI 24.91 kg/m   Constitutional :  Alert, cooperative, no distress  Lymphatics/Throat:  Supple, no lymphadenopathy  Respiratory:  clear to auscultation bilaterally  Cardiovascular:  regular rate and rhythm  Gastrointestinal: soft, non-tender; bowel sounds normal; no masses,  no organomegaly. inguinal hernia noted.  small, reducible, no overlying skin changes, and bilateral  Musculoskeletal: Steady gait and movement  Skin: Cool and moist  Psychiatric: Normal affect, non-agitated, not confused       LABS:  N/a   RADS: CLINICAL DATA:  Right groin swelling.   EXAM:  ULTRASOUND OF RIGHT GROIN SOFT TISSUES   TECHNIQUE:  Ultrasound examination of the groin soft tissues was performed in  the area of clinical concern.   COMPARISON:  None Available.   FINDINGS:  Elongated, heterogeneous echogenic lesion extends into the right  inguinal canal, measuring 4.0 x 1.7 x 1.8 cm. This moves with  Valsalva consistent with hernia.  IMPRESSION:  1. Right inguinal hernia. Heterogeneous material protrudes into the   inguinal canal that may reflect peritoneal fat or potentially bowel.  This corresponds to the palpable abnormality.    Electronically Signed    By: Alm Parkins M.D.    On: 11/16/2023 11:44  Assessment:       Non-recurrent bilateral inguinal hernia without obstruction or gangrene [K40.20]  Plan:     1. Non-recurrent bilateral inguinal hernia without obstruction or gangrene [K40.20]   Discussed the risk of surgery including recurrence, which can be up to 50% in the case of incisional or complex hernias, possible use of prosthetic materials (mesh) and the increased risk of mesh infxn if used, bleeding, chronic pain, post-op infxn, post-op SBO or ileus, and possible re-operation to address said risks. The risks of general anesthetic, if used, includes MI, CVA, sudden death or even reaction to anesthetic medications also discussed. Alternatives include continued observation.  Benefits include possible symptom relief, prevention of incarceration, strangulation, enlargement in size over time, and the risk of emergency surgery in the face of strangulation.   Typical post-op recovery time of 3-5 days with 2 weeks of activity restrictions were also discussed.  ED return precautions given for sudden increase in pain, size of hernia with accompanying fever, nausea, and/or vomiting.  The patient verbalized understanding and all questions were answered to the patient's satisfaction.   2. Patient has elected to proceed with surgical treatment. Procedure will be scheduled. bilateral, robotic assisted laparoscopic  labs/images/medications/previous chart entries reviewed personally and relevant changes/updates noted above.

## 2024-01-20 NOTE — H&P (Signed)
 Subjective:   CC: Non-recurrent bilateral inguinal hernia without obstruction or gangrene [K40.20]  HPI: referred by [name]* for evaluation of above.   History of Present Illness Martha Nguyen is a 78 year old female who presents with bilateral hernias for surgical evaluation.  Three months ago, she noticed a hernia on her left side, which has increased in size. The hernias are not painful and can be reduced manually. She is concerned about the impact on her active lifestyle, which includes lifting and working out at [employer], and is eager for surgery to resume activities without restrictions. Her surgical history includes only varicose vein removal, with no surgeries in the hernia area.   Past Medical History:  has a past medical history of Anxiety, Cat allergies, Depression, Fracture, Head ache, Headache, temporal (10/07/2016), History of cataract, Hyperlipidemia, Hypertension, Inguinal hernia, Insomnia, Ocular hypertension, Osteoarthritis, Pelvic pain in female, and Thickened endometrium.  Past Surgical History:  Past Surgical History:  Procedure Laterality Date   EXCISION FINGER BONE CYST/TUMOR Right 09/27/2008   cancer on nose  07/15/2018   face lift     vein stripping     wisdom teeth      Family History: family history includes Brain hemorrhage in her father; Dementia in her maternal grandmother and mother; Diabetes type II in her brother; Lung cancer in her father; Stroke in her father.  Social History:  reports that she has never smoked. She has never used smokeless tobacco. She reports that she does not currently use alcohol. She reports that she does not use drugs.  Current Medications: has a current medication list which includes the following prescription(s): acetaminophen , amlodipine, ammonium lactate, ascorbic acid (vitamin c), cholecalciferol, clobetasol , escitalopram oxalate, hyaluronate sodium, lactobacillus acidophilus, latanoprost,  losartan -hydrochlorothiazide , omega-3 fatty acids/fish oil, vit c/e/zn/coppr/lutein/zeaxan, zolpidem, and gabapentin .  Allergies:  Allergies as of 01/20/2024 - Reviewed 01/20/2024  Allergen Reaction Noted   Flagyl [metronidazole] Swelling 08/04/2023   Keflex [cephalexin] Shortness Of Breath 11/04/2013   Epinephrine Palpitations 11/24/2013   Septocaine [articaine-epinephrine] Other (See Comments) 11/04/2013   Amoxicillin-pot clavulanate Diarrhea and Anxiety 07/13/2014   Darvocet a500 [propoxyphene n-acetaminophen ] Nausea 11/04/2013   Darvon [propoxyphene] Nausea 11/04/2013   Erythromycin Nausea and Abdominal Pain 07/13/2014   Parafon forte [chlorzoxazone] Vomiting 11/04/2013    ROS:  A 15 point review of systems was performed and pertinent positives and negatives noted in HPI   Objective:     BP (!) 153/75   Pulse 66   Ht 156.2 cm (5' 1.5)   Wt 60.8 kg (134 lb)   BMI 24.91 kg/m   Constitutional :  Alert, cooperative, no distress  Lymphatics/Throat:  Supple, no lymphadenopathy  Respiratory:  clear to auscultation bilaterally  Cardiovascular:  regular rate and rhythm  Gastrointestinal: soft, non-tender; bowel sounds normal; no masses,  no organomegaly. inguinal hernia noted.  small, reducible, no overlying skin changes, and bilateral  Musculoskeletal: Steady gait and movement  Skin: Cool and moist  Psychiatric: Normal affect, non-agitated, not confused       LABS:  N/a   RADS: CLINICAL DATA:  Right groin swelling.   EXAM:  ULTRASOUND OF RIGHT GROIN SOFT TISSUES   TECHNIQUE:  Ultrasound examination of the groin soft tissues was performed in  the area of clinical concern.   COMPARISON:  None Available.   FINDINGS:  Elongated, heterogeneous echogenic lesion extends into the right  inguinal canal, measuring 4.0 x 1.7 x 1.8 cm. This moves with  Valsalva consistent with hernia.  IMPRESSION:  1. Right inguinal hernia. Heterogeneous material protrudes into the   inguinal canal that may reflect peritoneal fat or potentially bowel.  This corresponds to the palpable abnormality.    Electronically Signed    By: Alm Parkins M.D.    On: 11/16/2023 11:44  Assessment:       Non-recurrent bilateral inguinal hernia without obstruction or gangrene [K40.20]  Plan:     1. Non-recurrent bilateral inguinal hernia without obstruction or gangrene [K40.20]   Discussed the risk of surgery including recurrence, which can be up to 50% in the case of incisional or complex hernias, possible use of prosthetic materials (mesh) and the increased risk of mesh infxn if used, bleeding, chronic pain, post-op infxn, post-op SBO or ileus, and possible re-operation to address said risks. The risks of general anesthetic, if used, includes MI, CVA, sudden death or even reaction to anesthetic medications also discussed. Alternatives include continued observation.  Benefits include possible symptom relief, prevention of incarceration, strangulation, enlargement in size over time, and the risk of emergency surgery in the face of strangulation.   Typical post-op recovery time of 3-5 days with 2 weeks of activity restrictions were also discussed.  ED return precautions given for sudden increase in pain, size of hernia with accompanying fever, nausea, and/or vomiting.  The patient verbalized understanding and all questions were answered to the patient's satisfaction.   2. Patient has elected to proceed with surgical treatment. Procedure will be scheduled. bilateral, robotic assisted laparoscopic  labs/images/medications/previous chart entries reviewed personally and relevant changes/updates noted above.

## 2024-01-27 ENCOUNTER — Other Ambulatory Visit: Payer: Self-pay

## 2024-01-27 ENCOUNTER — Encounter
Admission: RE | Admit: 2024-01-27 | Discharge: 2024-01-27 | Disposition: A | Discharge status: Home / Self Care | Source: Ambulatory Visit | Attending: Surgery | Admitting: Surgery

## 2024-01-27 HISTORY — DX: Depression, unspecified: F32.A

## 2024-01-27 HISTORY — DX: Malignant (primary) neoplasm, unspecified: C80.1

## 2024-01-27 HISTORY — DX: Hyperlipidemia, unspecified: E78.5

## 2024-01-27 HISTORY — DX: Unspecified osteoarthritis, unspecified site: M19.90

## 2024-01-27 NOTE — Patient Instructions (Addendum)
 Your procedure is scheduled on: 01/29/24 Report to the Registration Desk on the 1st floor of the [facility]. To find out your arrival time, please call [phone] between 1PM - 3PM on: 01/28/24 If your arrival time is 6:00 am, do not arrive before that time as the [facility] entrance doors do not open until 6:00 am.  REMEMBER: Instructions that are not followed completely may result in serious medical risk, up to and including death; or upon the discretion of your surgeon and anesthesiologist your surgery may need to be rescheduled.  Do not eat food after midnight the night before surgery.  No gum chewing or hard candies.  You may however, drink CLEAR liquids up to 2 hours before you are scheduled to arrive for your surgery. Do not drink anything within 2 hours of your scheduled arrival time.  Clear liquids include: - water  - apple juice without pulp - gatorade (not RED colors) - black coffee or tea (Do NOT add milk or creamers to the coffee or tea) Do NOT drink anything that is not on this list.   One week prior to surgery: Stop Anti-inflammatories (NSAIDS) such as Advil, Aleve, Ibuprofen, Motrin, Naproxen, Naprosyn and Aspirin based products such as Excedrin, Goody's Powder, BC Powder. You may continue to take Tylenol  if needed for pain up until the day of surgery.  Stop ANY OVER THE COUNTER supplements until after surgery.   ON THE DAY OF SURGERY ONLY TAKE THESE MEDICATIONS WITH SIPS OF WATER:  amLODipine (NORVASC)  escitalopram (LEXAPRO)    No Alcohol for 24 hours before or after surgery.  No Smoking including e-cigarettes for 24 hours before surgery.  No chewable tobacco products for at least 6 hours before surgery.  No nicotine patches on the day of surgery.  Do not use any recreational drugs for at least a week (preferably 2 weeks) before your surgery.  Please be advised that the combination of cocaine and anesthesia may have negative outcomes, up to and  including death. If you test positive for cocaine, your surgery will be cancelled.  On the morning of surgery brush your teeth with toothpaste and water, you may rinse your mouth with mouthwash if you wish. Do not swallow any toothpaste or mouthwash.  Use CHG Soap or wipes as directed on instruction sheet.  Do not wear jewelry, make-up, hairpins, clips or nail polish.  For welded (permanent) jewelry: bracelets, anklets, waist bands, etc.  Please have this removed prior to surgery.  If it is not removed, there is a chance that hospital personnel will need to cut it off on the day of surgery.  Do not wear lotions, powders, or perfumes.   Do not shave body hair from the neck down 48 hours before surgery.  Contact lenses, hearing aids and dentures may not be worn into surgery.  Do not bring valuables to the hospital. [facility] is not responsible for any missing/lost belongings or valuables.   Notify your doctor if there is any change in your medical condition (cold, fever, infection).  Wear comfortable clothing (specific to your surgery type) to the hospital.  After surgery, you can help prevent lung complications by doing breathing exercises.  Take deep breaths and cough every 1-2 hours. Your doctor may order a device called an Incentive Spirometer to help you take deep breaths.  When coughing or sneezing, hold a pillow firmly against your incision with both hands. This is called "splinting." Doing this helps protect your incision. It also  decreases belly discomfort.  If you are being admitted to the hospital overnight, leave your suitcase in the car. After surgery it may be brought to your room.  In case of increased patient census, it may be necessary for you, the patient, to continue your postoperative care in the Same Day Surgery department.  If you are being discharged the day of surgery, you will not be allowed to drive home. You will need a responsible individual to drive you  home and stay with you for 24 hours after surgery.   If you are taking public transportation, you will need to have a responsible individual with you.  Please call the Pre-admissions Testing Dept. at [phone] if you have any questions about these instructions.  Surgery Visitation Policy:  Patients having surgery or a procedure may have two visitors.  Children under the age of 19 must have an adult with them who is not the patient.  Inpatient Visitation:    Visiting hours are 7 a.m. to 8 p.m. Up to four visitors are allowed at one time in a patient room. The visitors may rotate out with other people during the day.  One visitor age 36 or older may stay with the patient overnight and must be in the room by 8 p.m.   Merchandiser, retail to address health-related social needs:  [email or web address]

## 2024-01-29 ENCOUNTER — Ambulatory Visit
Admission: RE | Admit: 2024-01-29 | Discharge: 2024-01-29 | Disposition: A | Discharge status: Home / Self Care | Attending: Surgery | Admitting: Surgery

## 2024-01-29 ENCOUNTER — Other Ambulatory Visit: Payer: Self-pay

## 2024-01-29 ENCOUNTER — Ambulatory Visit: Admitting: Anesthesiology

## 2024-01-29 ENCOUNTER — Encounter: Payer: Self-pay | Admitting: Surgery

## 2024-01-29 ENCOUNTER — Encounter
Admission: RE | Disposition: A | Discharge status: Home / Self Care | Payer: Self-pay | Source: Home / Self Care | Attending: Surgery

## 2024-01-29 DIAGNOSIS — I10 Essential (primary) hypertension: Secondary | ICD-10-CM | POA: Insufficient documentation

## 2024-01-29 DIAGNOSIS — K4020 Bilateral inguinal hernia, without obstruction or gangrene, not specified as recurrent: Secondary | ICD-10-CM | POA: Insufficient documentation

## 2024-01-29 SURGERY — REPAIR, HERNIA, INGUINAL, ROBOT-ASSISTED, LAPAROSCOPIC, USING MESH
Anesthesia: General | Site: Abdomen | Laterality: Bilateral

## 2024-01-29 MED ORDER — OXYCODONE HCL 5 MG PO TABS
ORAL_TABLET | ORAL | Status: AC
  Filled 2024-01-29: qty 1

## 2024-01-29 MED ORDER — ONDANSETRON HCL 4 MG/2ML IJ SOLN
INTRAMUSCULAR | Status: DC | PRN
  Administered 2024-01-29: 4 mg via INTRAVENOUS

## 2024-01-29 MED ORDER — FENTANYL CITRATE (PF) 100 MCG/2ML IJ SOLN
INTRAMUSCULAR | Status: AC
  Filled 2024-01-29: qty 2

## 2024-01-29 MED ORDER — BUPIVACAINE-EPINEPHRINE (PF) 0.5% -1:200000 IJ SOLN
INTRAMUSCULAR | Status: AC
  Filled 2024-01-29: qty 30

## 2024-01-29 MED ORDER — GLYCOPYRROLATE 0.2 MG/ML IJ SOLN
INTRAMUSCULAR | Status: DC | PRN
  Administered 2024-01-29: .2 mg via INTRAVENOUS

## 2024-01-29 MED ORDER — TRAMADOL HCL 50 MG PO TABS
50.0000 mg | ORAL_TABLET | Freq: Three times a day (TID) | ORAL | 0 refills | Status: AC | PRN
  Filled 2024-01-29: qty 6, 2d supply, fill #0

## 2024-01-29 MED ORDER — ACETAMINOPHEN 10 MG/ML IV SOLN: 1000.0000 mg | Freq: Once | INTRAVENOUS | Status: DC | PRN

## 2024-01-29 MED ORDER — ACETAMINOPHEN 500 MG PO TABS
1000.0000 mg | ORAL_TABLET | ORAL | Status: AC
  Administered 2024-01-29: 1000 mg via ORAL

## 2024-01-29 MED ORDER — CHLORHEXIDINE GLUCONATE 0.12 % MT SOLN
15.0000 mL | Freq: Once | OROMUCOSAL | Status: AC
  Administered 2024-01-29: 15 mL via OROMUCOSAL

## 2024-01-29 MED ORDER — DEXAMETHASONE SODIUM PHOSPHATE 10 MG/ML IJ SOLN
INTRAMUSCULAR | Status: DC | PRN
  Administered 2024-01-29: 10 mg via INTRAVENOUS

## 2024-01-29 MED ORDER — OXYCODONE HCL 5 MG/5ML PO SOLN: 5.0000 mg | Freq: Once | ORAL | Status: AC | PRN

## 2024-01-29 MED ORDER — DOCUSATE SODIUM 100 MG PO CAPS
100.0000 mg | ORAL_CAPSULE | Freq: Two times a day (BID) | ORAL | 0 refills | Status: AC | PRN
  Filled 2024-01-29: qty 20, 10d supply, fill #0

## 2024-01-29 MED ORDER — VANCOMYCIN HCL IN DEXTROSE 1-5 GM/200ML-% IV SOLN
INTRAVENOUS | Status: AC
  Filled 2024-01-29: qty 200

## 2024-01-29 MED ORDER — 0.9 % SODIUM CHLORIDE (POUR BTL) OPTIME
TOPICAL | Status: DC | PRN
  Administered 2024-01-29: 500 mL

## 2024-01-29 MED ORDER — ORAL CARE MOUTH RINSE: 15.0000 mL | Freq: Once | OROMUCOSAL | Status: AC

## 2024-01-29 MED ORDER — OXYCODONE HCL 5 MG PO TABS
5.0000 mg | ORAL_TABLET | Freq: Once | ORAL | Status: AC | PRN
  Administered 2024-01-29: 5 mg via ORAL

## 2024-01-29 MED ORDER — BUPIVACAINE-EPINEPHRINE 0.5% -1:200000 IJ SOLN
INTRAMUSCULAR | Status: DC | PRN
Start: 2024-01-29 — End: 2024-01-29
  Administered 2024-01-29: 29 mL
  Administered 2024-01-29: 1 mL

## 2024-01-29 MED ORDER — FENTANYL CITRATE (PF) 100 MCG/2ML IJ SOLN
25.0000 ug | INTRAMUSCULAR | Status: DC | PRN
  Administered 2024-01-29: 50 ug via INTRAVENOUS

## 2024-01-29 MED ORDER — CHLORHEXIDINE GLUCONATE CLOTH 2 % EX PADS
6.0000 | MEDICATED_PAD | Freq: Once | CUTANEOUS | Status: AC
  Administered 2024-01-29: 6 via TOPICAL

## 2024-01-29 MED ORDER — SUGAMMADEX SODIUM 200 MG/2ML IV SOLN
INTRAVENOUS | Status: DC | PRN
  Administered 2024-01-29: 200 mg via INTRAVENOUS

## 2024-01-29 MED ORDER — ACETAMINOPHEN 500 MG PO TABS
ORAL_TABLET | ORAL | Status: AC
  Filled 2024-01-29: qty 2

## 2024-01-29 MED ORDER — LACTATED RINGERS IV SOLN: INTRAVENOUS | Status: DC

## 2024-01-29 MED ORDER — FENTANYL CITRATE (PF) 100 MCG/2ML IJ SOLN
INTRAMUSCULAR | Status: DC | PRN
  Administered 2024-01-29 (×2): 50 ug via INTRAVENOUS

## 2024-01-29 MED ORDER — CELECOXIB 200 MG PO CAPS
ORAL_CAPSULE | ORAL | Status: AC
  Filled 2024-01-29: qty 1

## 2024-01-29 MED ORDER — VANCOMYCIN HCL IN DEXTROSE 1-5 GM/200ML-% IV SOLN
1000.0000 mg | INTRAVENOUS | Status: AC
  Administered 2024-01-29: 1000 mg via INTRAVENOUS

## 2024-01-29 MED ORDER — PROPOFOL 10 MG/ML IV BOLUS
INTRAVENOUS | Status: AC
  Filled 2024-01-29: qty 20

## 2024-01-29 MED ORDER — BUPIVACAINE LIPOSOME 1.3 % IJ SUSP
INTRAMUSCULAR | Status: DC | PRN
  Administered 2024-01-29: 20 mL

## 2024-01-29 MED ORDER — ROCURONIUM BROMIDE 100 MG/10ML IV SOLN
INTRAVENOUS | Status: DC | PRN
  Administered 2024-01-29: 50 mg via INTRAVENOUS

## 2024-01-29 MED ORDER — PROPOFOL 10 MG/ML IV BOLUS
INTRAVENOUS | Status: DC | PRN
Start: 2024-01-29 — End: 2024-01-29
  Administered 2024-01-29: 120 mg via INTRAVENOUS

## 2024-01-29 MED ORDER — LIDOCAINE HCL (CARDIAC) PF 100 MG/5ML IV SOSY
PREFILLED_SYRINGE | INTRAVENOUS | Status: DC | PRN
  Administered 2024-01-29: 100 mg via INTRAVENOUS

## 2024-01-29 MED ORDER — ONDANSETRON HCL 4 MG/2ML IJ SOLN: 4.0000 mg | Freq: Once | INTRAMUSCULAR | Status: DC | PRN

## 2024-01-29 MED ORDER — CELECOXIB 200 MG PO CAPS
200.0000 mg | ORAL_CAPSULE | ORAL | Status: AC
  Administered 2024-01-29: 200 mg via ORAL

## 2024-01-29 MED ORDER — BUPIVACAINE LIPOSOME 1.3 % IJ SUSP
INTRAMUSCULAR | Status: AC
  Filled 2024-01-29: qty 20

## 2024-01-29 MED ORDER — EPHEDRINE SULFATE-NACL 50-0.9 MG/10ML-% IV SOSY
PREFILLED_SYRINGE | INTRAVENOUS | Status: DC | PRN
  Administered 2024-01-29: 5 mg via INTRAVENOUS
  Administered 2024-01-29: 10 mg via INTRAVENOUS
  Administered 2024-01-29: 5 mg via INTRAVENOUS

## 2024-01-29 MED ORDER — CHLORHEXIDINE GLUCONATE 0.12 % MT SOLN
OROMUCOSAL | Status: AC
  Filled 2024-01-29: qty 15

## 2024-01-29 SURGICAL SUPPLY — 38 items
BAG PRESSURE INF REUSE 1000 (BAG) IMPLANT
BNDG GAUZE DERMACEA FLUFF 4 (GAUZE/BANDAGES/DRESSINGS) ×1 IMPLANT
COVER TIP SHEARS 8 DVNC (MISCELLANEOUS) ×1 IMPLANT
COVER WAND RF STERILE (DRAPES) ×1 IMPLANT
DEFOGGER SCOPE WARM SEASHARP (MISCELLANEOUS) ×1 IMPLANT
DERMABOND ADVANCED .7 DNX12 (GAUZE/BANDAGES/DRESSINGS) ×1 IMPLANT
DRAPE ARM DVNC X/XI (DISPOSABLE) ×3 IMPLANT
DRAPE COLUMN DVNC XI (DISPOSABLE) ×1 IMPLANT
ELECTRODE REM PT RTRN 9FT ADLT (ELECTROSURGICAL) ×1 IMPLANT
FORCEPS BPLR FENES DVNC XI (FORCEP) ×1 IMPLANT
GLOVE BIOGEL PI IND STRL 7.0 (GLOVE) ×2 IMPLANT
GLOVE SURG SYN 6.5 PF PI (GLOVE) ×4 IMPLANT
GOWN STRL REUS W/ TWL LRG LVL3 (GOWN DISPOSABLE) ×4 IMPLANT
IRRIGATOR SUCT 8 DISP DVNC XI (IRRIGATION / IRRIGATOR) IMPLANT
IV NS 1000ML BAXH (IV SOLUTION) IMPLANT
LABEL OR SOLS (LABEL) IMPLANT
MANIFOLD NEPTUNE II (INSTRUMENTS) ×1 IMPLANT
MESH 3DMAX MID 4X6 LT LRG (Mesh General) IMPLANT
MESH 3DMAX MID 4X6 RT LRG (Mesh General) IMPLANT
NDL DRIVE SUT CUT DVNC (INSTRUMENTS) ×1 IMPLANT
NDL HYPO 22X1.5 SAFETY MO (MISCELLANEOUS) ×1 IMPLANT
NDL INSUFFLATION 14GA 120MM (NEEDLE) ×1 IMPLANT
NEEDLE DRIVE SUT CUT DVNC (INSTRUMENTS) ×1 IMPLANT
NEEDLE HYPO 22X1.5 SAFETY MO (MISCELLANEOUS) ×1 IMPLANT
NEEDLE INSUFFLATION 14GA 120MM (NEEDLE) ×1 IMPLANT
OBTURATOR OPTICALSTD 8 DVNC (TROCAR) ×1 IMPLANT
PACK LAP CHOLECYSTECTOMY (MISCELLANEOUS) ×1 IMPLANT
SCISSORS MNPLR CVD DVNC XI (INSTRUMENTS) ×1 IMPLANT
SEAL UNIV 5-12 XI (MISCELLANEOUS) ×3 IMPLANT
SET TUBE SMOKE EVAC HIGH FLOW (TUBING) ×1 IMPLANT
SOLUTION ELECTROSURG ANTI STCK (MISCELLANEOUS) ×1 IMPLANT
SUT STRATA 3-0 15 RB-1.5 (SUTURE) ×1 IMPLANT
SUT VIC AB 2-0 SH 27XBRD (SUTURE) ×1 IMPLANT
SUTURE MNCRL 4-0 27XMF (SUTURE) ×1 IMPLANT
SYR 30ML LL (SYRINGE) ×1 IMPLANT
TAPE TRANSPORE STRL 2 31045 (GAUZE/BANDAGES/DRESSINGS) ×1 IMPLANT
TRAP FLUID SMOKE EVACUATOR (MISCELLANEOUS) ×1 IMPLANT
WATER STERILE IRR 500ML POUR (IV SOLUTION) ×1 IMPLANT

## 2024-01-29 NOTE — Interval H&P Note (Signed)
 History and Physical Interval Note:  01/29/2024 8:13 AM  Martha Nguyen  has presented today for surgery, with the diagnosis of K40.20 Non recurrent bilateral inguinal hernia without obstruction or gangrene.  The various methods of treatment have been discussed with the patient and family. After consideration of risks, benefits and other options for treatment, the patient has consented to  Procedure(s): REPAIR, HERNIA, INGUINAL, ROBOT-ASSISTED, LAPAROSCOPIC, USING MESH (Bilateral) as a surgical intervention.  The patient's history has been reviewed, patient examined, no change in status, stable for surgery.  I have reviewed the patient's chart and labs.  Questions were answered to the patient's satisfaction.     [name]

## 2024-01-29 NOTE — Anesthesia Procedure Notes (Signed)
 Procedure Name: Intubation Date/Time: 01/29/2024 8:30 AM  Performed by: Belinda, Chandi Nicklin, CRNAPre-anesthesia Checklist: Patient identified, Emergency Drugs available, Suction available and Patient being monitored Patient Re-evaluated:Patient Re-evaluated prior to induction Oxygen Delivery Method: Circle system utilized Preoxygenation: Pre-oxygenation with 100% oxygen Induction Type: IV induction Ventilation: Mask ventilation without difficulty Laryngoscope Size: McGrath and 3 Grade View: Grade I Tube type: Oral Tube size: 6.5 mm Number of attempts: 1 Airway Equipment and Method: Stylet Placement Confirmation: ETT inserted through vocal cords under direct vision, positive ETCO2 and breath sounds checked- equal and bilateral Secured at: 18 cm Tube secured with: Tape Dental Injury: Teeth and Oropharynx as per pre-operative assessment

## 2024-01-29 NOTE — Discharge Instructions (Signed)
 Hernia repair, Care After ?This sheet gives you information about how to care for yourself after your procedure. Your health care provider may also give you more specific instructions. If you have problems or questions, contact your health care provider. ?What can I expect after the procedure? ?After your procedure, it is common to have the following: ?Pain in your abdomen, especially in the incision areas. You will be given medicine to control the pain. ?Tiredness. This is a normal part of the recovery process. Your energy level will return to normal over the next several weeks. ?Changes in your bowel movements, such as constipation or needing to go more often. Talk with your health care provider about how to manage this. ?Follow these instructions at home: ?Medicines ? tylenol and advil as needed for discomfort.  Please alternate between the two every four hours as needed for pain.   ? Use narcotics, if prescribed, only when tylenol and motrin is not enough to control pain. ? 325-650mg  every 8hrs to max of 3000mg /24hrs (including the 325mg  in every norco dose) for the tylenol.   ? Advil up to 800mg  per dose every 8hrs as needed for pain.   ?PLEASE RECORD NUMBER OF PILLS TAKEN UNTIL NEXT FOLLOW UP APPT.  THIS WILL HELP DETERMINE HOW READY YOU ARE TO BE RELEASED FROM ANY ACTIVITY RESTRICTIONS ?Do not drive or use heavy machinery while taking prescription pain medicine. ?Do not drink alcohol while taking prescription pain medicine. ? ?Incision care ? ?  ?Follow instructions from your health care provider about how to take care of your incision areas. Make sure you: ?Keep your incisions clean and dry. ?Wash your hands with soap and water before and after applying medicine to the areas, and before and after changing your bandage (dressing). If soap and water are not available, use hand sanitizer. ?Change your dressing as told by your health care provider. ?Leave stitches (sutures), skin glue, or adhesive strips in  place. These skin closures may need to stay in place for 2 weeks or longer. If adhesive strip edges start to loosen and curl up, you may trim the loose edges. Do not remove adhesive strips completely unless your health care provider tells you to do that. ?Do not wear tight clothing over the incisions. Tight clothing may rub and irritate the incision areas, which may cause the incisions to open. ?Do not take baths, swim, or use a hot tub until your health care provider approves. OK TO SHOWER IN 24HRS.   ?Check your incision area every day for signs of infection. Check for: ?More redness, swelling, or pain. ?More fluid or blood. ?Warmth. ?Pus or a bad smell. ?Activity ?Avoid lifting anything that is heavier than 10 lb (4.5 kg) for 2 weeks or until your health care provider says it is okay. ?No pushing/pulling greater than 30lbs ?You may resume normal activities as told by your health care provider. Ask your health care provider what activities are safe for you. ?Take rest breaks during the day as needed. ?Eating and drinking ?Follow instructions from your health care provider about what you can eat after surgery. ?To prevent or treat constipation while you are taking prescription pain medicine, your health care provider may recommend that you: ?Drink enough fluid to keep your urine clear or pale yellow. ?Take over-the-counter or prescription medicines. ?Eat foods that are high in fiber, such as fresh fruits and vegetables, whole grains, and beans. ?Limit foods that are high in fat and processed sugars, such as fried and  sweet foods. ?General instructions ?Ask your health care provider when you will need an appointment to get your sutures or staples removed. ?Keep all follow-up visits as told by your health care provider. This is important. ?Contact a health care provider if: ?You have more redness, swelling, or pain around your incisions. ?You have more fluid or blood coming from the incisions. ?Your incisions feel  warm to the touch. ?You have pus or a bad smell coming from your incisions or your dressing. ?You have a fever. ?You have an incision that breaks open (edges not staying together) after sutures or staples have been removed. ?You develop a rash. ?You have chest pain or difficulty breathing. ?You have pain or swelling in your legs. ?You feel light-headed or you faint. ?Your abdomen swells (becomes distended). ?You have nausea or vomiting. ?You have blood in your stool (feces). ?This information is not intended to replace advice given to you by your health care provider. Make sure you discuss any questions you have with your health care provider. ?Document Released: 01/10/2005 Document Revised: 03/12/2018 Document Reviewed: 03/24/2016 ?Elsevier Interactive Patient Education ? 2019 [employer] ?  ? ?

## 2024-01-29 NOTE — Transfer of Care (Signed)
 Immediate Anesthesia Transfer of Care Note  Patient: Martha Nguyen  Procedure(s) Performed: REPAIR, HERNIA, INGUINAL, ROBOT-ASSISTED, LAPAROSCOPIC, USING MESH (Bilateral: Abdomen)  Patient Location: PACU  Anesthesia Type:General  Level of Consciousness: drowsy  Airway & Oxygen Therapy: Patient Spontanous Breathing and Patient connected to face mask oxygen  Post-op Assessment: Report given to RN and Post -op Vital signs reviewed and stable  Post vital signs: Reviewed and stable  Last Vitals:  Vitals Value Taken Time  BP 146/62 01/29/24 10:00  Temp    Pulse 97 01/29/24 10:01  Resp 18 01/29/24 10:02  SpO2 100 % 01/29/24 10:01  Vitals shown include unfiled device data.  Last Pain:  Vitals:   01/29/24 0735  TempSrc: Temporal  PainSc: 0-No pain         Complications: No notable events documented.

## 2024-01-29 NOTE — Anesthesia Preprocedure Evaluation (Addendum)
 Anesthesia Evaluation  Patient identified by MRN, date of birth, ID band Patient awake    Reviewed: Allergy & Precautions, NPO status , Patient's Chart, lab work & pertinent test results  History of Anesthesia Complications Negative for: history of anesthetic complications  Airway Mallampati: IV   Neck ROM: Full    Dental no notable dental hx.    Pulmonary neg pulmonary ROS   Pulmonary exam normal breath sounds clear to auscultation       Cardiovascular hypertension, Normal cardiovascular exam Rhythm:Regular Rate:Normal     Neuro/Psych  PSYCHIATRIC DISORDERS  Depression    negative neurological ROS     GI/Hepatic negative GI ROS,,,  Endo/Other  negative endocrine ROS    Renal/GU negative Renal ROS     Musculoskeletal  (+) Arthritis ,    Abdominal   Peds  Hematology negative hematology ROS (+)   Anesthesia Other Findings   Reproductive/Obstetrics                              Anesthesia Physical Anesthesia Plan  ASA: 2  Anesthesia Plan: General   Post-op Pain Management:    Induction: Intravenous  PONV Risk Score and Plan: 3 and Ondansetron , Dexamethasone and Treatment may vary due to age or medical condition  Airway Management Planned: Oral ETT  Additional Equipment:   Intra-op Plan:   Post-operative Plan: Extubation in OR  Informed Consent: I have reviewed the patients History and Physical, chart, labs and discussed the procedure including the risks, benefits and alternatives for the proposed anesthesia with the patient or authorized representative who has indicated his/her understanding and acceptance.     Dental advisory given  Plan Discussed with: CRNA  Anesthesia Plan Comments: (Patient consented for risks of anesthesia including but not limited to:  - adverse reactions to medications - damage to eyes, teeth, lips or other oral mucosa - nerve damage due to  positioning  - sore throat or hoarseness - damage to heart, brain, nerves, lungs, other parts of body or loss of life  Informed patient about role of CRNA in peri- and intra-operative care.  Patient voiced understanding.)         Anesthesia Quick Evaluation

## 2024-01-29 NOTE — Anesthesia Postprocedure Evaluation (Signed)
 Anesthesia Post Note  Patient: [place] DELENA Bowers  Procedure(s) Performed: REPAIR, HERNIA, INGUINAL, ROBOT-ASSISTED, LAPAROSCOPIC, USING MESH (Bilateral: Abdomen)  Patient location during evaluation: PACU Anesthesia Type: General Level of consciousness: awake and alert, oriented and patient cooperative Pain management: pain level controlled Vital Signs Assessment: post-procedure vital signs reviewed and stable Respiratory status: spontaneous breathing, nonlabored ventilation and respiratory function stable Cardiovascular status: blood pressure returned to baseline and stable Postop Assessment: adequate PO intake Anesthetic complications: no   No notable events documented.   Last Vitals:  Vitals:   01/29/24 1045 01/29/24 1109  BP: (!) 119/57 (!) 131/58  Pulse: 70 69  Resp: 12 20  Temp: 36.4 C (!) 36 C  SpO2: 97% 99%    Last Pain:  Vitals:   01/29/24 1119  TempSrc:   PainSc: 5                  Alfonso Ruths

## 2024-01-29 NOTE — Op Note (Signed)
 Preoperative diagnosis: bilateral, initial, reducible inguinal Hernia.  Postoperative diagnosis: same  Procedure: Robotic assisted laparoscopic bilateral inguinal hernia repair with mesh  Anesthesia: General  Surgeon: Dr. Tye  Wound Classification: Clean  Specimen: none  Complications: None  Estimated Blood Loss: 10mL   Indications:  inguinal hernia. Repair was indicated to avoid complications of incarceration, obstruction and pain, and a prosthetic mesh repair was elected.  See H&P for further details.  Findings: Round ligaments identified and preserved 2. Bard 3D max medium weight mesh used for repair 3. Adequate hemostasis achieved  Description of procedure: The patient was taken to the operating room. A time-out was completed verifying correct patient, procedure, site, positioning, and implant(s) and/or special equipment prior to beginning this procedure.  Area was prepped and draped in the usual sterile fashion. An incision was marked 20 cm above the pubic tubercle, slightly above the umbilicus    Veress needle inserted at palmer's point.  Saline drop test noted to be positive with gradual increase in pressure after initiation of gas insufflation.  15 mm of pressure was achieved prior to removing the Veress needle and then placing a 8 mm port via the Optiview technique through the supraumbilical site.  Inspection of the area afterwards noted no injury to the surrounding organs during insertion of the needle and the port.  2 port sites were marked 8 cm to the lateral sides of the initial port, and a 8 mm robotic port was placed on the left side same area as veress needle, another 8 mm robotic port on the right side under direct supervision.  Local anesthesia  infused to the preplanned incision sites prior to insertion of the port.  The [employer] platform was then brought into the operative field and docked to the ports.  Examination of the abdominal cavity noted a bilateral  inguinal hernia. Right side addressed first. A peritoneal flap was created approximately 8cm cephalad to the defect by using scissors with electrocautery.  Dissection was carried down towards the pubic tubercle, developing the myopectineal orifice view.  Laterally the flap was carried towards the ASIS.  Small hernia sac was noted, which carefully dissected away from the adjacent tissues to be fully reduced out of hernia cavity.  Any bleeding was controlled with combination of electrocautery and manual pressure.    After confirming adequate dissection and the peritoneal reflection completely down and away from the cord structures, a Large Bard 3DMax medium weight mesh was placed within the anterior abdominal wall, secured in place using 2-0 Vicryl on an SH needle immediately above the pubic tubercle.  After noting proper placement of the mesh with the peritoneal reflection deep to it, the previously created peritoneal flap was secured back up to the anterior abdominal wall using running 3-0 V-Lock.  Both needles were then removed out of the abdominal cavity.  Left side addressed next. A peritoneal flap was created approximately 8cm cephalad to the defect by using scissors with electrocautery.  Dissection was carried down towards the pubic tubercle, developing the myopectineal orifice view.  Laterally the flap was carried towards the ASIS.  Small hernia sac was noted, which carefully dissected away from the adjacent tissues to be fully reduced out of hernia cavity.  Any bleeding was controlled with combination of electrocautery and manual pressure.    After confirming adequate dissection and the peritoneal reflection completely down and away from the cord structures, a Large Bard 3DMax medium weight mesh was placed within the anterior abdominal wall, secured  in place using 2-0 Vicryl on an SH needle immediately above the pubic tubercle.  After noting proper placement of the mesh with the peritoneal reflection  deep to it, the previously created peritoneal flap was secured back up to the anterior abdominal wall using running 3-0 V-Lock.  Both needles were then removed out of the abdominal cavity. Xi platform undocked from the ports and removed off of operative field.  exparel infused as ilioinguinal block bilaterally.  Abdomen then desufflated and ports removed. All the skin incisions were then closed with a subcuticular stitch of Monocryl 4-0. Dermabond was applied. The patient tolerated the procedure well and was taken to the postanesthesia care unit in stable condition. Sponge and instrument count correct at end of procedure.

## 2024-02-01 ENCOUNTER — Encounter: Payer: Self-pay | Admitting: Surgery

## 2024-08-06 ENCOUNTER — Emergency Department
Admission: EM | Admit: 2024-08-06 | Discharge: 2024-08-06 | Disposition: A | Discharge status: Home / Self Care | Attending: Emergency Medicine | Admitting: Emergency Medicine

## 2024-08-06 ENCOUNTER — Other Ambulatory Visit: Payer: Self-pay

## 2024-08-06 DIAGNOSIS — R55 Syncope and collapse: Secondary | ICD-10-CM | POA: Insufficient documentation

## 2024-08-06 DIAGNOSIS — I10 Essential (primary) hypertension: Secondary | ICD-10-CM | POA: Insufficient documentation

## 2024-08-06 DIAGNOSIS — R059 Cough, unspecified: Secondary | ICD-10-CM | POA: Insufficient documentation

## 2024-08-06 DIAGNOSIS — Z5321 Procedure and treatment not carried out due to patient leaving prior to being seen by health care provider: Secondary | ICD-10-CM | POA: Insufficient documentation

## 2024-08-06 LAB — COMPREHENSIVE METABOLIC PANEL WITH GFR
ALT: 7 U/L (ref 0–44)
AST: 21 U/L (ref 15–41)
Albumin: 4.1 g/dL (ref 3.5–5.0)
Alkaline Phosphatase: 82 U/L (ref 38–126)
Anion gap: 12 (ref 5–15)
BUN: 25 mg/dL — ABNORMAL HIGH (ref 8–23)
CO2: 24 mmol/L (ref 22–32)
Calcium: 9 mg/dL (ref 8.9–10.3)
Chloride: 99 mmol/L (ref 98–111)
Creatinine, Ser: 0.88 mg/dL (ref 0.44–1.00)
GFR, Estimated: >60 mL/min
Glucose, Bld: 117 mg/dL — ABNORMAL HIGH (ref 70–99)
Potassium: 3.9 mmol/L (ref 3.5–5.1)
Sodium: 135 mmol/L (ref 135–145)
Total Bilirubin: 0.4 mg/dL (ref 0.0–1.2)
Total Protein: 7 g/dL (ref 6.5–8.1)

## 2024-08-06 LAB — CBC
HCT: 39.2 % (ref 36.0–46.0)
Hemoglobin: 12.8 g/dL (ref 12.0–15.0)
MCH: 30.1 pg (ref 26.0–34.0)
MCHC: 32.7 g/dL (ref 30.0–36.0)
MCV: 92.2 fL (ref 80.0–100.0)
Platelets: 246 10*3/uL (ref 150–400)
RBC: 4.25 MIL/uL (ref 3.87–5.11)
RDW: 13 % (ref 11.5–15.5)
WBC: 7.5 10*3/uL (ref 4.0–10.5)
nRBC: 0 % (ref 0.0–0.2)

## 2024-08-06 NOTE — ED Triage Notes (Signed)
 Pt to ED from home [facility] for near syncopal episode and dizziness this AM. No LOC. EMS gave PTA, initial BP was 89/50 and then 142/84 after fluids. CBG was 125.  Pt is alert and oriented, not dizzy anymore. Denies SOB, CP.

## 2024-08-06 NOTE — ED Triage Notes (Signed)
 First Nurse Note:  Pt via [facility] from home. Pt c/o near syncopal episode, sudden onset of dizziness. Felt like she was going to pass. No LOC. EMS reports hx of HTN, states she took her BP and I was 109/80 but she still took her BP meds. Also reports cough. Pt is A&Ox4 and NAD   EMS reports: 89/50 initially EMS gave bolus, 142/84 most recent, 55 HR, 96% on RA, 125 CBG

## 2024-08-07 ENCOUNTER — Telehealth: Admitting: Family

## 2024-08-07 DIAGNOSIS — J0110 Acute frontal sinusitis, unspecified: Secondary | ICD-10-CM | POA: Diagnosis not present

## 2024-08-07 DIAGNOSIS — J208 Acute bronchitis due to other specified organisms: Secondary | ICD-10-CM | POA: Diagnosis not present

## 2024-08-07 DIAGNOSIS — B9689 Other specified bacterial agents as the cause of diseases classified elsewhere: Secondary | ICD-10-CM | POA: Diagnosis not present

## 2024-08-07 MED ORDER — BENZONATATE 200 MG PO CAPS: 200.0000 mg | ORAL_CAPSULE | Freq: Two times a day (BID) | ORAL | 0 refills | Status: AC | PRN

## 2024-08-07 MED ORDER — AZITHROMYCIN 250 MG PO TABS: ORAL_TABLET | ORAL | 0 refills | Status: AC

## 2024-08-07 NOTE — Patient Instructions (Signed)
 Sinus Infection in Adults: What to Know  A sinus infection, also called sinusitis, is when your sinuses are swollen and irritated. Sinuses are small spaces around the bones in your face. They are in many places around your face, like around your eyes and behind your nose. Mucus flows out of your sinuses. This mucus can get stuck or blocked in the sinus space if the tissue in your nose swells. This allows germs to grow and cause an infection.  The infection can be short-term, lasting up to 4 weeks. Or it can be a long-term infection that lasts longer than 12 weeks. What are the causes? A sinus infection can be caused by: Allergies or asthma. Germs, like bacteria, viruses, or a fungus. Abnormal shapes or blockages in your nose or sinuses. Growths called polyps in your nose. Air pollutants or chemicals. What increases the risk? Having a weak immune system. This is the body's defense system. Having allergies. Smoking. What are the signs or symptoms? Thick mucus from your nose that's yellow or green. Pain or pressure around your sinuses. A cough that's worse at night. Loss of smell or taste. A sore throat. Feeling very tired. How is this diagnosed? A sinus infection is diagnosed based on your symptoms and a physical exam. You may need tests to see if the infection is short-term or long-term. This may include: Looking inside your nose for polyps. Using a small camera with a light called an endoscope to see your sinuses. Allergy testing. Imaging tests like an MRI or CT scan. Rarely, a biopsy of your bone may be done to check for a serious fungal infection. How is this treated? Treatment depends on what caused the infection. If it's caused by a virus, your symptoms should go away in 10-14 days without treatment. If it's caused by bacteria, your health care provider may wait to see if you get better on your own. Most bacterial infections get better without antibiotics. Your provider may  give you medicines, such as: Decongestants to reduce swelling. Nose spray. Saline rinses to clear thick mucus. Allergy medicines. Medicines for pain you can buy at the store. If your nose passages are very narrow or have polyps blocking the sinuses, you may need surgery. Follow these instructions at home: Medicines Take your medicines only as told. If you were given antibiotics, take them as told. Do not stop taking them even if you start to feel better. Hydrate and humidify Drink more fluids as told. Inhale steam for 10-15 minutes, 3-4 times a day or as told. You can do this in the bathroom while a hot shower is running. Rest Rest as told. Ask what things are safe for you to do at home. Ask when you can go back to work or school. Sleep with your head raised. General instructions  To help with pain, use a warm, moist cloth on your face 3-4 times a day. Use saline rinses in your nose as often as told by your provider. Wash your hands often with soap and water for at least 20 seconds. If you can't use soap and water, use hand sanitizer. Do not smoke, vape, or use nicotine or tobacco. Contact a health care provider if: You have a fever. Your symptoms get worse. You feel confused. Get help right away if: You can't stop throwing up. You have very bad pain or swelling in your face. You have vision problems. Your neck is stiff. You have trouble breathing. These symptoms may be an emergency. Call 911  right away. Do not wait to see if the symptoms will go away. Do not drive yourself to the hospital. This information is not intended to replace advice given to you by your health care provider. Make sure you discuss any questions you have with your health care provider. Document Revised: 01/21/2024 Document Reviewed: 01/21/2024 Elsevier Patient Education  2025 [employer].

## 2024-08-07 NOTE — Progress Notes (Signed)
 " Virtual Visit Consent   [name], you are scheduled for a virtual visit with a [facility] provider today. Just as with appointments in the office, your consent must be obtained to participate. Your consent will be active for this visit and any virtual visit you may have with one of our providers in the next 365 days. If you have a MyChart account, a copy of this consent can be sent to you electronically.  As this is a virtual visit, video technology does not allow for your provider to perform a traditional examination. This may limit your provider's ability to fully assess your condition. If your provider identifies any concerns that need to be evaluated in person or the need to arrange testing (such as labs, EKG, etc.), we will make arrangements to do so. Although advances in technology are sophisticated, we cannot ensure that it will always work on either your end or our end. If the connection with a video visit is poor, the visit may have to be switched to a telephone visit. With either a video or telephone visit, we are not always able to ensure that we have a secure connection.  By engaging in this virtual visit, you consent to the provision of healthcare and authorize for your insurance to be billed (if applicable) for the services provided during this visit. Depending on your insurance coverage, you may receive a charge related to this service.  I need to obtain your verbal consent now. Are you willing to proceed with your visit today? KANNA DAFOE has provided verbal consent on 08/07/2024 for a virtual visit (video or telephone). Bari Learn, FNP  Date: 08/07/2024 5:05 PM   Virtual Visit via Video Note   I, Bari Learn, connected with  Martha Nguyen  ([id number], 1946-02-02) on 08/07/24 at  5:30 PM EST by a video-enabled telemedicine application and verified that I am speaking with the correct person using two identifiers.  Location: Patient: Virtual Visit Location  Patient: Home Provider: Virtual Visit Location Provider: Home Office   I discussed the limitations of evaluation and management by telemedicine and the availability of in person appointments. The patient expressed understanding and agreed to proceed.    History of Present Illness: Martha Nguyen is a 79 y.o. who identifies as a female who was assigned female at birth, and is being seen today for cough and sinus congestion for 9 days. Reports it started as a URI that has improved, but continues to have cough and sinus pressure.   HPI: Sinusitis This is a new problem. The current episode started 1 to 4 weeks ago. The problem has been gradually worsening since onset. There has been no fever. Her pain is at a severity of 6/10. The pain is moderate. Associated symptoms include congestion, coughing, headaches, a hoarse voice, sinus pressure and sneezing. Past treatments include oral decongestants and acetaminophen . The treatment provided mild relief.  Cough This is a new problem. The current episode started 1 to 4 weeks ago. The problem has been gradually worsening. The problem occurs every few minutes. The cough is Non-productive. Associated symptoms include headaches and rhinorrhea.    Problems:  Patient Active Problem List   Diagnosis Date Noted   Left inguinal hernia 01/17/2020    Allergies: Allergies[1] Medications: Current Medications[2]  Observations/Objective: Patient is well-developed, well-nourished in no acute distress.  Resting comfortably  at home.  Head is normocephalic, atraumatic.  No labored breathing.  Speech is clear and coherent with logical content.  Patient is alert and oriented at baseline.  Frontal pressure  Dry nonproductive cough  Assessment and Plan: 1. Acute frontal sinusitis, recurrence not specified (Primary) - azithromycin  (ZITHROMAX ) 250 MG tablet; Take 500 mg once, then 250 mg for four days  Dispense: 6 tablet; Refill: 0  2. Acute bacterial  bronchitis - azithromycin  (ZITHROMAX ) 250 MG tablet; Take 500 mg once, then 250 mg for four days  Dispense: 6 tablet; Refill: 0  Pt has multiple allergies,  Erythromycin on allergy list. However, she states she can only tolerate Zpak and does not have an allergy to this.  - Take meds as prescribed - Use a cool mist humidifier  -Use saline nose sprays frequently -Force fluids -For any cough or congestion  Use plain Mucinex- regular strength or max strength is fine -For fever or aces or pains- take tylenol  or ibuprofen. -Throat lozenges if help -Follow up in person if symptoms worsen or do not improve   Follow Up Instructions: I discussed the assessment and treatment plan with the patient. The patient was provided an opportunity to ask questions and all were answered. The patient agreed with the plan and demonstrated an understanding of the instructions.  A copy of instructions were sent to the patient via MyChart unless otherwise noted below.     The patient was advised to call back or seek an in-person evaluation if the symptoms worsen or if the condition fails to improve as anticipated.    Bari Learn, FNP    [1]  Allergies Allergen Reactions   Cephalexin Anaphylaxis   Articaine-Epinephrine      Other reaction(s): Unknown Other reaction(s): Unknown    Darvon [Propoxyphene] Nausea And Vomiting   Epinephrine  Hcl (Nasal) Other (See Comments)    Increased blood pressure    Erythromycin Other (See Comments)    Unknown    Flagyl [Metronidazole] Swelling    Tongue swelling   Other Other (See Comments)    Onions - garlic  Causes her to feel like she is going to pass out   Parafon Forte Dsc [Chlorzoxazone] Nausea And Vomiting   Augmentin [Amoxicillin-Pot Clavulanate] Diarrhea and Anxiety    Anxiety, insomnia, diarrhea   [2]  Current Outpatient Medications:    azithromycin  (ZITHROMAX ) 250 MG tablet, Take 500 mg once, then 250 mg for four days, Disp: 6 tablet, Rfl: 0    acidophilus (RISAQUAD) CAPS capsule, Take 1 capsule by mouth daily., Disp: , Rfl:    amLODipine (NORVASC) 5 MG tablet, Take 5 mg by mouth daily., Disp: , Rfl:    ammonium lactate (LAC-HYDRIN) 12 % lotion, Apply 1 Application topically as needed for dry skin., Disp: , Rfl:    B Complex-C (SUPER B COMPLEX PO), Take 1 tablet by mouth daily., Disp: , Rfl:    Calcium Carbonate-Vitamin D 600-400 MG-UNIT tablet, Take 1 tablet by mouth daily., Disp: , Rfl:    Cholecalciferol 125 MCG (5000 UT) capsule, Take 5,000 Units by mouth daily., Disp: , Rfl:    escitalopram (LEXAPRO) 5 MG tablet, Take 5 mg by mouth daily., Disp: , Rfl:    ibuprofen (ADVIL) 200 MG tablet, Take 200 mg by mouth daily as needed for moderate pain (pain score 4-6)., Disp: , Rfl:    latanoprost (XALATAN) 0.005 % ophthalmic solution, Place 1 drop into both eyes at bedtime., Disp: , Rfl:    losartan -hydrochlorothiazide  (HYZAAR) 100-12.5 MG per tablet, Take 1 tablet by mouth daily., Disp: , Rfl:    Multiple Vitamins-Minerals (PRESERVISION AREDS 2 PO), Take 2  capsules by mouth daily., Disp: , Rfl:    Omega-3 Fatty Acids (FISH OIL) 1200 MG CAPS, Take 1,200 mg by mouth daily., Disp: , Rfl:    traMADol  (ULTRAM ) 50 MG tablet, Take 1 tablet (50 mg total) by mouth every 8 (eight) hours as needed., Disp: 6 tablet, Rfl: 0   TURMERIC PO, Take 1 capsule by mouth daily., Disp: , Rfl:    zolpidem (AMBIEN) 10 MG tablet, Take 5 mg by mouth at bedtime., Disp: , Rfl:   "
